# Patient Record
Sex: Female | Born: 1940 | Race: White | Hispanic: No | Smoking: Former smoker
Health system: Southern US, Community
[De-identification: ages and names within clinical notes are randomized; demographics above are authoritative.]

## PROBLEM LIST (undated history)

## (undated) DIAGNOSIS — A159 Respiratory tuberculosis unspecified: Secondary | ICD-10-CM

## (undated) DIAGNOSIS — F32A Depression, unspecified: Secondary | ICD-10-CM

## (undated) DIAGNOSIS — K589 Irritable bowel syndrome without diarrhea: Secondary | ICD-10-CM

## (undated) DIAGNOSIS — R011 Cardiac murmur, unspecified: Secondary | ICD-10-CM

## (undated) DIAGNOSIS — J449 Chronic obstructive pulmonary disease, unspecified: Secondary | ICD-10-CM

## (undated) DIAGNOSIS — I4891 Unspecified atrial fibrillation: Secondary | ICD-10-CM

## (undated) DIAGNOSIS — C801 Malignant (primary) neoplasm, unspecified: Secondary | ICD-10-CM

## (undated) DIAGNOSIS — G709 Myoneural disorder, unspecified: Secondary | ICD-10-CM

## (undated) DIAGNOSIS — F419 Anxiety disorder, unspecified: Secondary | ICD-10-CM

## (undated) DIAGNOSIS — IMO0002 Reserved for concepts with insufficient information to code with codable children: Secondary | ICD-10-CM

## (undated) DIAGNOSIS — M1990 Unspecified osteoarthritis, unspecified site: Secondary | ICD-10-CM

## (undated) DIAGNOSIS — T7840XA Allergy, unspecified, initial encounter: Secondary | ICD-10-CM

## (undated) HISTORY — DX: Unspecified atrial fibrillation: I48.91

## (undated) HISTORY — DX: Allergy, unspecified, initial encounter: T78.40XA

## (undated) HISTORY — PX: ABDOMINAL HYSTERECTOMY: SHX81

## (undated) HISTORY — DX: Depression, unspecified: F32.A

## (undated) HISTORY — PX: HYSTERECTOMY ABDOMINAL WITH SALPINGECTOMY: SHX6725

## (undated) HISTORY — DX: Anxiety disorder, unspecified: F41.9

## (undated) HISTORY — DX: Unspecified osteoarthritis, unspecified site: M19.90

## (undated) HISTORY — DX: Respiratory tuberculosis unspecified: A15.9

## (undated) HISTORY — DX: Malignant (primary) neoplasm, unspecified: C80.1

## (undated) HISTORY — DX: Myoneural disorder, unspecified: G70.9

## (undated) HISTORY — DX: Irritable bowel syndrome, unspecified: K58.9

## (undated) HISTORY — DX: Reserved for concepts with insufficient information to code with codable children: IMO0002

## (undated) HISTORY — PX: REDUCTION MAMMAPLASTY: SUR839

## (undated) HISTORY — PX: COSMETIC SURGERY: SHX468

## (undated) HISTORY — DX: Cardiac murmur, unspecified: R01.1

## (undated) HISTORY — PX: CARDIAC SURGERY: SHX584

## (undated) HISTORY — PX: EYE SURGERY: SHX253

## (undated) HISTORY — DX: Chronic obstructive pulmonary disease, unspecified: J44.9

## (undated) HISTORY — PX: CHOLECYSTECTOMY: SHX55

---

## 2004-11-11 ENCOUNTER — Ambulatory Visit: Payer: Self-pay | Admitting: Unknown Physician Specialty

## 2015-12-17 ENCOUNTER — Other Ambulatory Visit: Payer: Self-pay | Admitting: Family Medicine

## 2015-12-17 DIAGNOSIS — Z1231 Encounter for screening mammogram for malignant neoplasm of breast: Secondary | ICD-10-CM

## 2015-12-17 DIAGNOSIS — Z7185 Encounter for immunization safety counseling: Secondary | ICD-10-CM | POA: Insufficient documentation

## 2015-12-26 ENCOUNTER — Ambulatory Visit: Payer: Self-pay

## 2016-06-03 ENCOUNTER — Other Ambulatory Visit: Payer: Self-pay | Admitting: Obstetrics and Gynecology

## 2016-06-03 DIAGNOSIS — N6452 Nipple discharge: Secondary | ICD-10-CM

## 2016-06-11 ENCOUNTER — Other Ambulatory Visit: Payer: Self-pay | Admitting: *Deleted

## 2016-06-11 ENCOUNTER — Inpatient Hospital Stay
Admission: RE | Admit: 2016-06-11 | Discharge: 2016-06-11 | Disposition: A | Discharge status: Home / Self Care | Payer: Self-pay | Source: Ambulatory Visit | Attending: *Deleted | Admitting: *Deleted

## 2016-06-11 DIAGNOSIS — Z9289 Personal history of other medical treatment: Secondary | ICD-10-CM

## 2016-06-23 ENCOUNTER — Ambulatory Visit: Payer: Self-pay

## 2016-06-23 ENCOUNTER — Other Ambulatory Visit: Payer: Self-pay

## 2016-06-25 ENCOUNTER — Ambulatory Visit
Admission: RE | Admit: 2016-06-25 | Discharge: 2016-06-25 | Disposition: A | Discharge status: Home / Self Care | Payer: Medicare Other | Source: Ambulatory Visit | Attending: Obstetrics and Gynecology | Admitting: Obstetrics and Gynecology

## 2016-06-25 DIAGNOSIS — N6452 Nipple discharge: Secondary | ICD-10-CM

## 2017-01-26 DIAGNOSIS — I2510 Atherosclerotic heart disease of native coronary artery without angina pectoris: Secondary | ICD-10-CM | POA: Insufficient documentation

## 2017-01-26 DIAGNOSIS — I480 Paroxysmal atrial fibrillation: Secondary | ICD-10-CM | POA: Insufficient documentation

## 2017-01-26 DIAGNOSIS — E782 Mixed hyperlipidemia: Secondary | ICD-10-CM | POA: Insufficient documentation

## 2018-03-03 DIAGNOSIS — M791 Myalgia, unspecified site: Secondary | ICD-10-CM | POA: Insufficient documentation

## 2018-03-03 DIAGNOSIS — Z13820 Encounter for screening for osteoporosis: Secondary | ICD-10-CM | POA: Insufficient documentation

## 2018-03-03 DIAGNOSIS — M2550 Pain in unspecified joint: Secondary | ICD-10-CM | POA: Insufficient documentation

## 2018-03-03 DIAGNOSIS — R768 Other specified abnormal immunological findings in serum: Secondary | ICD-10-CM | POA: Insufficient documentation

## 2018-05-10 DIAGNOSIS — R0609 Other forms of dyspnea: Secondary | ICD-10-CM | POA: Insufficient documentation

## 2018-05-10 DIAGNOSIS — M1990 Unspecified osteoarthritis, unspecified site: Secondary | ICD-10-CM | POA: Insufficient documentation

## 2018-05-10 DIAGNOSIS — R0600 Dyspnea, unspecified: Secondary | ICD-10-CM | POA: Insufficient documentation

## 2018-06-02 ENCOUNTER — Other Ambulatory Visit: Payer: Self-pay | Admitting: Obstetrics and Gynecology

## 2018-06-02 DIAGNOSIS — Z1231 Encounter for screening mammogram for malignant neoplasm of breast: Secondary | ICD-10-CM

## 2018-06-02 DIAGNOSIS — M25551 Pain in right hip: Secondary | ICD-10-CM | POA: Insufficient documentation

## 2018-06-02 DIAGNOSIS — M25552 Pain in left hip: Secondary | ICD-10-CM | POA: Insufficient documentation

## 2018-06-24 ENCOUNTER — Ambulatory Visit
Admission: RE | Admit: 2018-06-24 | Discharge: 2018-06-24 | Disposition: A | Discharge status: Home / Self Care | Payer: Medicare HMO | Source: Ambulatory Visit | Attending: Obstetrics and Gynecology | Admitting: Obstetrics and Gynecology

## 2018-06-24 ENCOUNTER — Encounter: Payer: Self-pay | Admitting: Radiology

## 2018-06-24 DIAGNOSIS — Z1231 Encounter for screening mammogram for malignant neoplasm of breast: Secondary | ICD-10-CM | POA: Diagnosis not present

## 2018-08-23 DIAGNOSIS — R000 Tachycardia, unspecified: Secondary | ICD-10-CM | POA: Insufficient documentation

## 2018-08-23 DIAGNOSIS — F419 Anxiety disorder, unspecified: Secondary | ICD-10-CM | POA: Insufficient documentation

## 2019-11-20 ENCOUNTER — Other Ambulatory Visit (HOSPITAL_COMMUNITY): Payer: Self-pay | Admitting: Gastroenterology

## 2019-11-20 ENCOUNTER — Other Ambulatory Visit: Payer: Self-pay | Admitting: Gastroenterology

## 2019-11-20 DIAGNOSIS — R1013 Epigastric pain: Secondary | ICD-10-CM

## 2019-11-23 ENCOUNTER — Ambulatory Visit
Admission: RE | Admit: 2019-11-23 | Discharge: 2019-11-23 | Disposition: A | Discharge status: Home / Self Care | Payer: Medicare HMO | Source: Ambulatory Visit | Attending: Gastroenterology | Admitting: Gastroenterology

## 2019-11-23 ENCOUNTER — Other Ambulatory Visit: Payer: Self-pay

## 2019-11-23 ENCOUNTER — Other Ambulatory Visit: Payer: Self-pay | Admitting: Gastroenterology

## 2019-11-23 DIAGNOSIS — R1013 Epigastric pain: Secondary | ICD-10-CM

## 2020-05-13 DIAGNOSIS — K219 Gastro-esophageal reflux disease without esophagitis: Secondary | ICD-10-CM | POA: Insufficient documentation

## 2020-10-29 NOTE — Progress Notes (Signed)
[facility]  [address] [place], [place] [address] Phone: [phone]  Fax: [phone]   Clinic Day:  10/30/2020  Referring physician: Jenell Milliner, MD  Chief Complaint: [place] is a 80 y.o. female with polycythemia who is referred in consultation by Dr. Jenell Brewer for assessment and management.   HPI: The patient was seen by Dr. Vinson Brewer on 10/17/2020 for a 6 month wellness visit. Hematocrit was 48.9, hemoglobin 15.7, platelets 368,000, WBC 8,700. Calcium was 10.5 (8.7-10.4). ANA was positive (speckled: 1:80). Rheumatoid factor was < 3.5.  Sed rate was 11 and CRP was <4.0. TSH was normal.  She was noted to have persistent elevated hematocrit over the past year. She was referred to hematology.  Labs followed: 12/23/2015:  Hematocrit 44.8, hemoglobin 14.6, platelets 333,000, WBC 5,600. 01/08/2017:  Hematocrit 41.6, hemoglobin 13.8, platelets 317,000, WBC 6,000. 02/14/2018:  Hematocrit 45.4, hemoglobin 14.6, platelets 411,000, WBC 6,400. 07/11/2019:  Hematocrit 47.0, hemoglobin 15.1, platelets 387,000, WBC 8,300. 10/23/2019:  Hematocrit 49.4, hemoglobin 15.9, platelets 335,000, WBC 8,200. Calcium 10.6. 05/13/2020:  Hematocrit 48.5, hemoglobin 16.0, platelets 386,000, WBC 8,800.  10/17/2020:  Hematocrit 48.9, hemoglobin 15.7, platelets 368,000, WBC 8,700.  Erythropoietin was 6.6 (2.6-18.5) on 05/13/2020.  Symptomatically, she has been "fine". She reports fatigue, on and off fevers, sweats, runny nose, seasonal allergies, shortness of breath on exertion, headaches, "arthritis in all of her joints," nausea, shin and left arm pain, dizziness if she gets up too fast, and patches of dry, itchy skin.   She is not eating well because of nausea and depression. She used to vomit a lot but since she started eating smaller portions, it has resolved. She does not drink a lot of water.  The patient used to have tingling and burning in her feet and it  started to go up her legs. She started taking a nitric oxide inhibitor and it has resolved.  She does not think she has sleep apnea. She wakes up almost every hour during the night. She does not wake up feeling refreshed.  She states that her "colon burst due to diverticulitis" about 8 years ago. She also had a silent heart attack 8 years ago while in [place]. She has COPD and IBS. She used to have atrial fibrillation but it has resolved.  She does not do well with medications. She prefers to take vitamins. She does not take testosterone.  She had COVID-19 and is a "long hauler." She received the COVID-19 vaccine.  She was adopted; she does not have a complete family history. She does know that her mother and maternal aunt had cancer.   Past Medical History:  Diagnosis Date  . Afib (HCC)   . IBS (irritable bowel syndrome)     Past Surgical History:  Procedure Laterality Date  . CARDIAC SURGERY     corrected Afib  . CHOLECYSTECTOMY    . HYSTERECTOMY ABDOMINAL WITH SALPINGECTOMY     she has one ovary  . REDUCTION MAMMAPLASTY      Family History  Adopted: Yes  Problem Relation Age of Onset  . Cancer Mother     Social History:  reports that she has quit smoking. Her smoking use included cigarettes. She does not have any smokeless tobacco history on file. No history on file for alcohol use and drug use. The patient quit smoking 40 years ago. She smoked a "carton a week, maybe more" for four years. Before that, she smoked a much smaller amount starting in high  school. 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 She drinks a glass of wine occasionally but does not keep alcohol in her home. She previously lived in [place]. She denies exposure to radiation or toxins. She used to work as a Human resources officer. The patient is accompanied by her son, [place], today.  Allergies:  Allergies  Allergen Reactions  . Ciprofloxacin Other (See Comments)    Caused the inside of her jaw and tongue to  peel Caused the inside of her jaw and tongue to peel   . Duloxetine Other (See Comments)    Caused pt to be "violently ill" Caused pt to be "violently ill"   . Ezetimibe Other (See Comments)    GI side effects GI side effects   . Hydroxyzine Other (See Comments)    Side effects Side effects   . Paroxetine Other (See Comments)    Caused dizziness, vomiting, and diarrhea Caused dizziness, vomiting, and diarrhea   . Statins Other (See Comments)    Other Reaction: SEVERE MUSCLE PAINS Other Reaction: SEVERE MUSCLE PAINS     Current Medications: Current Outpatient Medications  Medication Sig Dispense Refill  . b complex vitamins capsule Take 1 capsule by mouth daily.    . Multiple Vitamin (MULTIVITAMIN) tablet Take 1 tablet by mouth daily.    . NON FORMULARY Heal n soothe (systemic enzyme formula)    . NON FORMULARY Colon support    . NON FORMULARY Multi collagen    . NON FORMULARY Nitric oxide sanguenol    . NON FORMULARY Bioactive liver rejuvenix liver support formula dietary supplement    . NON FORMULARY Fruits and veggies    . Turmeric 400 MG CAPS Take by mouth.    Marland Kitchen VITAMIN K PO Take by mouth.     No current facility-administered medications for this visit.    Review of Systems  Constitutional: Positive for diaphoresis, fever (on and off) and malaise/fatigue. Negative for chills and weight loss.  HENT: Negative for congestion, ear discharge, ear pain, hearing loss, nosebleeds, sinus pain, sore throat and tinnitus.        Runny nose.  Eyes: Positive for blurred vision (after reading).  Respiratory: Positive for shortness of breath (on exertion). Negative for cough, hemoptysis and sputum production.        COPD.  Cardiovascular: Negative for chest pain, palpitations and leg swelling.  Gastrointestinal: Positive for nausea. Negative for abdominal pain, blood in stool, constipation, diarrhea, heartburn, melena and vomiting.       Does not eat well.  Genitourinary:  Negative for dysuria, frequency, hematuria and urgency.  Musculoskeletal: Positive for joint pain. Negative for back pain, myalgias and neck pain.       Anterior tibia and left arm pain.  Skin: Positive for itching. Negative for rash.       Dry.  Neurological: Positive for dizziness (if she gets up too fast) and headaches. Negative for tingling, sensory change and weakness.  Endo/Heme/Allergies: Positive for environmental allergies. Does not bruise/bleed easily.  Psychiatric/Behavioral: Positive for depression. Negative for memory loss. The patient has insomnia. The patient is not nervous/anxious.   All other systems reviewed and are negative.  Performance status (ECOG): 1  Vitals Blood pressure (!) 164/77, pulse (!) 59, temperature (!) 97.3 F (36.3 C), temperature source Tympanic, resp. rate 18, weight 143 lb 4.8 oz (65 kg), SpO2 99 %.   Physical Exam Vitals and nursing note reviewed.  Constitutional:      General: She is not in acute  distress.    Appearance: She is not diaphoretic.  HENT:     Head: Normocephalic and atraumatic.     Comments: Short gray hair.    Mouth/Throat:     Mouth: Mucous membranes are moist.     Pharynx: Oropharynx is clear.  Eyes:     General: No scleral icterus.    Extraocular Movements: Extraocular movements intact.     Conjunctiva/sclera: Conjunctivae normal.     Pupils: Pupils are equal, round, and reactive to light.     Comments: Brown eyes.  Cardiovascular:     Rate and Rhythm: Normal rate and regular rhythm.     Heart sounds: Normal heart sounds. No murmur heard.   Pulmonary:     Effort: Pulmonary effort is normal. No respiratory distress.     Breath sounds: Normal breath sounds. No wheezing or rales.  Chest:     Chest wall: No tenderness.  Breasts:     Right: No axillary adenopathy or supraclavicular adenopathy.     Left: No axillary adenopathy or supraclavicular adenopathy.    Abdominal:     General: Bowel sounds are normal. There is  no distension.     Palpations: Abdomen is soft. There is no mass.     Tenderness: There is no abdominal tenderness. There is no guarding or rebound.  Musculoskeletal:        General: No swelling or tenderness. Normal range of motion.     Cervical back: Normal range of motion and neck supple.  Lymphadenopathy:     Head:     Right side of head: No preauricular, posterior auricular or occipital adenopathy.     Left side of head: No preauricular, posterior auricular or occipital adenopathy.     Cervical: No cervical adenopathy.     Upper Body:     Right upper body: No supraclavicular or axillary adenopathy.     Left upper body: No supraclavicular or axillary adenopathy.     Lower Body: No right inguinal adenopathy. No left inguinal adenopathy.  Skin:    General: Skin is warm and dry.  Neurological:     Mental Status: She is alert and oriented to person, place, and time.  Psychiatric:        Behavior: Behavior normal.        Thought Content: Thought content normal.        Judgment: Judgment normal.    No visits with results within 3 Day(s) from this visit.  Latest known visit with results is:  No results found for any previous visit.    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  Hemoglobin has ranged between 15.1 - 16.0 without trend since 07/11/2019.     Labs on 10/17/2020 revealed a hematocrit 48.9, hemoglobin 15.7, platelets 368,000, WBC 8,700.  Erythropoietin was 6.6 (2.6-18.5) on 05/13/2020.  The patient received the COVID-19 vaccine in 01/2020.  She had COVID-19 infection.  Symptomatically, she notes fatigue, on and off fevers, sweats, shortness of breath on exertion, and diffuse arthritis.  She does not think she has sleep apnea; she does not wake up feeling refreshed.  She does not take testosterone or erythropoietin.  Exam reveals no adenopathy or hepatosplenomegaly.    Plan: 1.   Labs today:  CBC with diff, CMP, ferritin, iron studies,  erythropoietin, carbon monoxide level, JAK2 with reflex.   2.   Urinalysis. 3.   CXR (PA and lateral). 4.   Erythrocytosis  She has had a hemoglobin  between 15.7 - 16.0 in the past year.  She has no cardiopulmonary disease, sleep apnea, use of erythropoietin or testosterone, or smoking.  Discuss typical work-up for erythrocytosis in women with hemoglobin > 16.   Discuss labs, urinalysis and chest x-ray.  Several questions asked and answered. 5.   CXR (PA and lateral) today. 6.   RTC in 1 week for MD assessment, review of work-up and discussion regarding direction of therapy.  I discussed the assessment and treatment plan with the patient.  The patient was provided an opportunity to ask questions and all were answered.  The patient agreed with the plan and demonstrated an understanding of the instructions.  The patient was advised to call back if the symptoms worsen or if the condition fails to improve as anticipated.  I provided 37 minutes of face-to-face time during this this encounter and > 50% was spent counseling as documented under my assessment and plan. An additional 15 minutes were spent reviewing her chart (Epic and Care Everywhere) including notes, labs, and imaging studies.   Zackry Deines C. Merlene Pullingorcoran, MD, PhD    10/30/2020, 3:27 PM  I, Danella PentonEmily J Tufford, am acting as Neurosurgeonscribe for General MotorsMelissa C. Merlene Pullingorcoran, MD, PhD.  I, Kahil Agner C. Merlene Pullingorcoran, MD, have reviewed the above documentation for accuracy and completeness, and I agree with the above.

## 2020-10-30 ENCOUNTER — Encounter: Payer: Self-pay | Admitting: Hematology and Oncology

## 2020-10-30 ENCOUNTER — Ambulatory Visit
Admission: RE | Admit: 2020-10-30 | Discharge: 2020-10-30 | Disposition: A | Discharge status: Home / Self Care | Payer: Medicare HMO | Source: Ambulatory Visit | Attending: Hematology and Oncology | Admitting: Hematology and Oncology

## 2020-10-30 ENCOUNTER — Inpatient Hospital Stay: Payer: Medicare HMO | Attending: Hematology and Oncology | Admitting: Hematology and Oncology

## 2020-10-30 ENCOUNTER — Other Ambulatory Visit: Payer: Self-pay

## 2020-10-30 ENCOUNTER — Ambulatory Visit
Admission: RE | Admit: 2020-10-30 | Discharge: 2020-10-30 | Disposition: A | Discharge status: Home / Self Care | Payer: Medicare HMO | Attending: Hematology and Oncology | Admitting: Hematology and Oncology

## 2020-10-30 ENCOUNTER — Inpatient Hospital Stay: Payer: Medicare HMO

## 2020-10-30 VITALS — BP 164/77 | HR 59 | Temp 97.3°F | Resp 18 | Wt 143.3 lb

## 2020-10-30 DIAGNOSIS — D751 Secondary polycythemia: Secondary | ICD-10-CM

## 2020-10-30 DIAGNOSIS — D509 Iron deficiency anemia, unspecified: Secondary | ICD-10-CM | POA: Diagnosis not present

## 2020-10-30 LAB — FERRITIN: Ferritin: 56 ng/mL (ref 11–307)

## 2020-10-30 LAB — COMPREHENSIVE METABOLIC PANEL
ALT: 19 U/L (ref 0–44)
AST: 19 U/L (ref 15–41)
Albumin: 4.5 g/dL (ref 3.5–5.0)
Alkaline Phosphatase: 67 U/L (ref 38–126)
Anion gap: 11 (ref 5–15)
BUN: 11 mg/dL (ref 8–23)
CO2: 27 mmol/L (ref 22–32)
Calcium: 9.5 mg/dL (ref 8.9–10.3)
Chloride: 104 mmol/L (ref 98–111)
Creatinine, Ser: 0.69 mg/dL (ref 0.44–1.00)
GFR, Estimated: >60 mL/min (ref >60)
Glucose, Bld: 95 mg/dL (ref 70–99)
Potassium: 3.9 mmol/L (ref 3.5–5.1)
Sodium: 142 mmol/L (ref 135–145)
Total Bilirubin: 0.3 mg/dL (ref 0.3–1.2)
Total Protein: 7.7 g/dL (ref 6.5–8.1)

## 2020-10-30 LAB — CBC WITH DIFFERENTIAL/PLATELET
Abs Immature Granulocytes: 0.07 10*3/uL (ref 0.00–0.07)
Basophils Absolute: 0.1 10*3/uL (ref 0.0–0.1)
Basophils Relative: 1 %
Eosinophils Absolute: 0.2 10*3/uL (ref 0.0–0.5)
Eosinophils Relative: 3 %
HCT: 44.4 % (ref 36.0–46.0)
Hemoglobin: 14.6 g/dL (ref 12.0–15.0)
Immature Granulocytes: 1 %
Lymphocytes Relative: 37 %
Lymphs Abs: 2.8 10*3/uL (ref 0.7–4.0)
MCH: 30.4 pg (ref 26.0–34.0)
MCHC: 32.9 g/dL (ref 30.0–36.0)
MCV: 92.5 fL (ref 80.0–100.0)
Monocytes Absolute: 0.6 10*3/uL (ref 0.1–1.0)
Monocytes Relative: 8 %
Neutro Abs: 3.8 10*3/uL (ref 1.7–7.7)
Neutrophils Relative %: 50 %
Platelets: 343 10*3/uL (ref 150–400)
RBC: 4.8 MIL/uL (ref 3.87–5.11)
RDW: 13.2 % (ref 11.5–15.5)
WBC: 7.5 10*3/uL (ref 4.0–10.5)
nRBC: 0 % (ref 0.0–0.2)

## 2020-10-30 LAB — IRON AND TIBC
Iron: 74 ug/dL (ref 28–170)
Saturation Ratios: 21 % (ref 10.4–31.8)
TIBC: 358 ug/dL (ref 250–450)
UIBC: 284 ug/dL

## 2020-10-30 LAB — URINALYSIS, COMPLETE (UACMP) WITH MICROSCOPIC
Bilirubin Urine: NEGATIVE
Glucose, UA: NEGATIVE mg/dL
Hgb urine dipstick: NEGATIVE
Ketones, ur: NEGATIVE mg/dL
Leukocytes,Ua: NEGATIVE
Nitrite: NEGATIVE
Protein, ur: NEGATIVE mg/dL
Specific Gravity, Urine: 1.01 (ref 1.005–1.030)
pH: 5.5 (ref 5.0–8.0)

## 2020-10-30 NOTE — Progress Notes (Signed)
One black out episode recently SOB on exertion COPD Numbness and ting in extremeties

## 2020-10-31 LAB — CARBON MONOXIDE, BLOOD (PERFORMED AT REF LAB): Carbon Monoxide, Blood: 2.1 % (ref 0.0–3.6)

## 2020-10-31 LAB — ERYTHROPOIETIN: Erythropoietin: 8.3 m[IU]/mL (ref 2.6–18.5)

## 2020-11-05 NOTE — Progress Notes (Signed)
[facility]  [address], [address] [place], [place] [address] Phone: [phone]  Fax: [phone]   Clinic Day:  11/06/2020  Referring physician: Jenell MillinerLuyando, Yvonne, MD  Chief Complaint: Stacy Burkentoinette N Brewer is a 80 y.o. female with polycythemia who is seen for review of work-up and discussion regarding direction of therapy.  HPI: The patient was last seen in the hematology clinic on 10/30/2020 for new patient assessment.  Hemoglobin had ranged between 15.1-16 without trend since 07/11/2019.  Symptomatically, she felt fine.  She noted fatigue, on and off fevers, sweats, shortness of breath on exertion, and diffuse arthritis.  She was not eating well; she did not drink a lot of water.  She denied sleep apnea and she woke up almost every hour during the night.  She was noted to have COPD.  Work-up revealed a hematocrit of 44.4, hemoglobin 14.6, platelets 343,000, WBC 7,500 (ANC 3800). CMP was normal. Ferritin was 56 with an iron saturation of 21% and a TIBC of 358. Carbon monoxide was 2.1% (0-3.6%). Erythropoietin level was 8.3 (2.6-18.5). Urinalysis revealed no hematuria. JAK2 studies are pending.  CXR on 10/30/2020 revealed hyperexpanded lungs. There was no edema or airspace opacity. Heart size was normal. There was no adenopathy.  During the interim, she has felt "great." Her energy level is very low. Her runny nose, dizziness, headaches, blurred vision after reading, dry skin, and shortness of breath on exertion are stable. Her diet is okay. She eats a lot of vegetables and is trying to cut back on her red meat intake. Her neck is swollen in the morning. Her mood is up and down.  She takes Heal-n-Soothe and a nitric oxide inhibitor for her joints. Her fevers and sweats have resolved.  The patient wants to sign a DNR today. States that "if it's her time to go, it's her time." She is very satisfied with her life and states that she is not afraid of death.   Past  Medical History:  Diagnosis Date  . Afib (HCC)   . IBS (irritable bowel syndrome)     Past Surgical History:  Procedure Laterality Date  . CARDIAC SURGERY     corrected Afib  . CHOLECYSTECTOMY    . HYSTERECTOMY ABDOMINAL WITH SALPINGECTOMY     she has one ovary  . REDUCTION MAMMAPLASTY      Family History  Adopted: Yes  Problem Relation Age of Onset  . Cancer Mother     Social History:  reports that she has quit smoking. Her smoking use included cigarettes. She has never used smokeless tobacco. She reports previous alcohol use. She reports previous drug use. The patient quit smoking 40 years ago. She smoked a "carton a week, maybe more" for four years. Before that, she smoked a much smaller amount starting in high school. 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 She drinks a glass of wine occasionally but does not keep alcohol in her home. [place] She denies exposure to radiation or toxins. 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 Her son is Jomarie LongsJoseph. The patient is alone today.  Allergies:  Allergies  Allergen Reactions  . Ciprofloxacin Other (See Comments)    Caused the inside of her jaw and tongue to peel Caused the inside of her jaw and tongue to peel   . Duloxetine Other (See Comments)    Caused pt to be "violently ill" Caused pt to be "violently ill"   .  Ezetimibe Other (See Comments)    GI side effects GI side effects   . Hydroxyzine Other (See Comments)    Side effects Side effects   . Paroxetine Other (See Comments)    Caused dizziness, vomiting, and diarrhea Caused dizziness, vomiting, and diarrhea   . Statins Other (See Comments)    Other Reaction: SEVERE MUSCLE PAINS Other Reaction: SEVERE MUSCLE PAINS     Current Medications: Current Outpatient Medications  Medication Sig Dispense Refill  . b complex vitamins capsule Take 1 capsule by mouth daily.    . Multiple Vitamin (MULTIVITAMIN) tablet  Take 1 tablet by mouth daily.    . NON FORMULARY Heal n soothe (systemic enzyme formula)    . NON FORMULARY Colon support    . NON FORMULARY daily. Multi collagen    . NON FORMULARY daily. Nitric oxide sanguenol    . NON FORMULARY Fruits and veggies    . Turmeric 400 MG CAPS Take by mouth daily.    . NON FORMULARY Bioactive liver rejuvenix liver support formula dietary supplement (Patient not taking: Reported on 11/06/2020)    . VITAMIN K PO Take by mouth. (Patient not taking: Reported on 11/06/2020)     No current facility-administered medications for this visit.    Review of Systems  Constitutional: Positive for malaise/fatigue and weight loss (1 lb). Negative for chills, diaphoresis and fever.  HENT: Negative for congestion, ear discharge, ear pain, hearing loss, nosebleeds, sinus pain, sore throat and tinnitus.        Runny nose  Eyes: Positive for blurred vision (after reading).  Respiratory: Positive for shortness of breath (on exertion). Negative for cough, hemoptysis and sputum production.   Cardiovascular: Negative for chest pain, palpitations and leg swelling.       COPD  Gastrointestinal: Negative for abdominal pain, blood in stool, constipation, diarrhea, heartburn, melena, nausea and vomiting.  Genitourinary: Negative for dysuria, frequency, hematuria and urgency.  Musculoskeletal: Positive for joint pain. Negative for back pain, myalgias and neck pain.       Shin and left arm pain. Neck is swollen in the morning.  Skin: Positive for itching. Negative for rash.       Dry  Neurological: Positive for dizziness (if she gets up too fast) and headaches. Negative for tingling, sensory change and weakness.  Endo/Heme/Allergies: Positive for environmental allergies. Does not bruise/bleed easily.  Psychiatric/Behavioral: Negative for depression (mood is up and down) and memory loss. The patient is not nervous/anxious and does not have insomnia.   All other systems reviewed and are  negative.  Performance status (ECOG): 1  Vitals Blood pressure (!) 149/86, pulse 97, temperature 98.2 F (36.8 C), temperature source Oral, weight 142 lb 8.4 oz (64.7 kg), SpO2 100 %.   Physical Exam Vitals and nursing note reviewed.  Constitutional:      General: She is not in acute distress.    Appearance: She is not diaphoretic.  HENT:     Head:     Comments: Short gray hair. Eyes:     General: No scleral icterus.    Conjunctiva/sclera: Conjunctivae normal.     Comments: Brown eyes.  Neurological:     Mental Status: She is alert and oriented to person, place, and time.  Psychiatric:        Behavior: Behavior normal.        Thought Content: Thought content normal.        Judgment: Judgment normal.    No visits with results within 3  Day(s) from this visit.  Latest known visit with results is:  Clinical Support on 10/30/2020  Component Date Value Ref Range Status  . Carbon Monoxide, Blood 10/30/2020 2.1  0.0 - 3.6 % Final   Comment: (NOTE)                            Environmental Exposure:                             Nonsmokers           <3.7                             Smokers              <9.9                            Occupational Exposure:                             BEI                   3.5                                Detection Limit =  0.2 Performed At: [employer] [address] [place], [place] [id number] Jolene Schimke MD WU:[id number]   . JAK2 GenotypR 10/30/2020 Comment   Final   Comment: (NOTE) Result: NEGATIVE for the JAK2 V617F mutation. Interpretation:  The G to T nucleotide change encoding the V617F mutation was not detected.  This result does not rule out the presence of the JAK2 mutation at a level below the sensitivity of detection of this assay, or the presence of other mutations within JAK2 not detected by this assay.  This result does not rule out a diagnosis of polycythemia vera, essential thrombocythemia or idiopathic  myelofibrosis as the V617F mutation is not detected in all patients with these disorders.   Marland Kitchen BACKGROUND: 10/30/2020 Comment   Final   Comment: (NOTE) JAK2 is a cytoplasmic tyrosine kinase with a key role in signal transduction from multiple hematopoietic growth factor receptors. A point mutation within exon 14 of the JAK2 gene (N5621H) encoding a valine to phenylalanine substitution at position 617 of the JAK2 protein (V617F) has been identified in most patients with polycythemia vera, and in about half of those with either essential thrombocythemia or idiopathic myelofibrosis. The V617F has also been detected, although infrequently, in other myeloid disorders such as chronic myelomonocytic leukemia and chronic neutrophilic luekemia. V617F is an acquired mutation that alters a highly conserved valine present in the negative regulatory JH2 domain of the JAK2 protein and is predicted to dysregulate kinase activity. Methodology: Total genomic DNA was extracted and subjected to TaqMan real-time PCR amplification/detection. Two amplification products per sample were monitored by real-time PCR using primers/probes s                          pecific to JAK2 wild type (WT) and JAK2 mutant V617F. The ABI7900 Absolute Quantitation software will compare the patient specimen valuse to the standard curves and generate percent values for wild type and mutant type. In  vitro studies have indicated that this assay has an analytical sensitivity of 1%. References: Baxter EJ, Scott Lenoria FarrierLM, Campbell PJ, et al. Acquired mutation of the tyrosine kinase JAK2 in human myeloproliferative disorders. Lancet. 2005 Mar 19-25; 365(9464):1054-1061. Milus BanisterJames C, Ugo V, Le Couedic JP. A unique clonal JAK2 mutation leading to constitutive signaling causes polycythaemia vera. Nature. 2005 Apr 28; 434(7037):1144-1148. Kralovics R, Passamonti F, Buser AS, et al. A gain-of-function mutation of JAK2 in myeloproliferative  disorders. N Engl J Med. 2005 Apr 28; 352(17):1779-1790.   . Director Review, JAK2 10/30/2020 Comment   Final   Comment: (NOTE) Mellody Lifean Wang, PhD, [facility]    Director, Molecular Oncology    [facility]    [place], [place] [address]    [phone] This test was developed and its performance characteristics determined by [facility]. It has not been cleared or approved by the Food and Drug Administration.   Marland Kitchen. REFLEX: 10/30/2020 Comment   Final   Comment: (NOTE) Reflex to CALR Mutation Analysis, JAK2 Exon 12-15 Mutation Analysis, and MPL Mutation Analysis is indicated.   Marland Kitchen. Extraction 10/30/2020 Completed   Corrected   Comment: (NOTE) Performed At: [facility] [address] [place], [place] [id number] Maurine Simmeringhenn Anjen MDPhD ZH:[phone] Performed At: [facility] [address] [place], [place] [id number] Maurine Simmeringhenn Anjen MDPhD [phone]   . Erythropoietin 10/30/2020 8.3  2.6 - 18.5 mIU/mL Final   Comment: (NOTE) Beckman Coulter UniCel DxI 800 Immunoassay System Values obtained with different assay methods or kits cannot be used interchangeably. Results cannot be interpreted as absolute evidence of the presence or absence of malignant disease. Performed At: [facility] [place] [address] [place], [place] [id number] Jolene SchimkeNagendra Sanjai MD ZD:[id number]:[phone]   . Iron 10/30/2020 74  28 - 170 ug/dL Final  . TIBC 95/18/841601/19/2022 358  250 - 450 ug/dL Final  . Saturation Ratios 10/30/2020 21  10.4 - 31.8 % Final  . UIBC 10/30/2020 284  ug/dL Final   Performed at [facility], [address] [place], [place] [address]  . Ferritin 10/30/2020 56  11 - 307 ng/mL Final   Performed at [facility], [address] [place], [place] [address]  . Sodium 10/30/2020 142  135 - 145 mmol/L Final  . Potassium 10/30/2020 3.9  3.5 - 5.1 mmol/L Final  . Chloride 10/30/2020 104  98 - 111 mmol/L Final  . CO2 10/30/2020 27  22 - 32 mmol/L Final   . Glucose, Bld 10/30/2020 95  70 - 99 mg/dL Final   Glucose reference range applies only to samples taken after fasting for at least 8 hours.  . BUN 10/30/2020 11  8 - 23 mg/dL Final  . Creatinine, Ser 10/30/2020 0.69  0.44 - 1.00 mg/dL Final  . Calcium 93/23/557301/19/2022 9.5  8.9 - 10.3 mg/dL Final  . Total Protein 10/30/2020 7.7  6.5 - 8.1 g/dL Final  . Albumin 22/02/542701/19/2022 4.5  3.5 - 5.0 g/dL Final  . AST 06/23/762801/19/2022 19  15 - 41 U/L Final  . ALT 10/30/2020 19  0 - 44 U/L Final  . Alkaline Phosphatase 10/30/2020 67  38 - 126 U/L Final  . Total Bilirubin 10/30/2020 0.3  0.3 - 1.2 mg/dL Final  . GFR, Estimated 10/30/2020 >60  >60 mL/min Final   Comment: (NOTE) Calculated using the CKD-EPI Creatinine Equation (2021)   . Anion gap 10/30/2020 11  5 - 15 Final   Performed at [facility],  [address], [place], [place] [address]  . WBC 10/30/2020 7.5  4.0 - 10.5 K/uL Final  . RBC 10/30/2020 4.80  3.87 - 5.11 MIL/uL Final  . Hemoglobin 10/30/2020 14.6  12.0 - 15.0 g/dL Final  . HCT 12/45/8099 44.4  36.0 - 46.0 % Final  . MCV 10/30/2020 92.5  80.0 - 100.0 fL Final  . MCH 10/30/2020 30.4  26.0 - 34.0 pg Final  . MCHC 10/30/2020 32.9  30.0 - 36.0 g/dL Final  . RDW 83/38/2505 13.2  11.5 - 15.5 % Final  . Platelets 10/30/2020 343  150 - 400 K/uL Final  . nRBC 10/30/2020 0.0  0.0 - 0.2 % Final  . Neutrophils Relative % 10/30/2020 50  % Final  . Neutro Abs 10/30/2020 3.8  1.7 - 7.7 K/uL Final  . Lymphocytes Relative 10/30/2020 37  % Final  . Lymphs Abs 10/30/2020 2.8  0.7 - 4.0 K/uL Final  . Monocytes Relative 10/30/2020 8  % Final  . Monocytes Absolute 10/30/2020 0.6  0.1 - 1.0 K/uL Final  . Eosinophils Relative 10/30/2020 3  % Final  . Eosinophils Absolute 10/30/2020 0.2  0.0 - 0.5 K/uL Final  . Basophils Relative 10/30/2020 1  % Final  . Basophils Absolute 10/30/2020 0.1  0.0 - 0.1 K/uL Final  . Immature Granulocytes 10/30/2020 1  % Final  . Abs Immature Granulocytes 10/30/2020  0.07  0.00 - 0.07 K/uL Final   Performed at [facility], [address], [place], [place] [address]  . Color, Urine 10/30/2020 YELLOW  YELLOW Final  . APPearance 10/30/2020 CLEAR  CLEAR Final  . Specific Gravity, Urine 10/30/2020 1.010  1.005 - 1.030 Final  . pH 10/30/2020 5.5  5.0 - 8.0 Final  . Glucose, UA 10/30/2020 NEGATIVE  NEGATIVE mg/dL Final  . Hgb urine dipstick 10/30/2020 NEGATIVE  NEGATIVE Final  . Bilirubin Urine 10/30/2020 NEGATIVE  NEGATIVE Final  . Ketones, ur 10/30/2020 NEGATIVE  NEGATIVE mg/dL Final  . Protein, ur 34/19/3790 NEGATIVE  NEGATIVE mg/dL Final  . Nitrite 24/06/7352 NEGATIVE  NEGATIVE Final  . Glori Luis 10/30/2020 NEGATIVE  NEGATIVE Final  . Squamous Epithelial / LPF 10/30/2020 0-5  0 - 5 Final  . WBC, UA 10/30/2020 6-10  0 - 5 WBC/hpf Final  . RBC / HPF 10/30/2020 0-5  0 - 5 RBC/hpf Final  . Bacteria, UA 10/30/2020 FEW* NONE SEEN Final  . WBC Clumps 10/30/2020 PRESENT   Final   Performed at [facility], [address], [place], [place] [address]  . CALR Mutation Detection Result 10/30/2020 Comment   Final   Comment: (NOTE) NEGATIVE No insertions or deletions were detected within the analyzed region of the calreticulin (CALR) gene. A negative result does not entirely exclude the possibility of a clonal population carrying CALR gene mutations that are not covered by this assay. Results should be interpreted in conjunction with clinical and laboratory findings for the most accurate interpretation.   . Background: 10/30/2020 Comment   Final   Comment: (NOTE) The calcium-binding endoplasmic reticulin chaperone protein, calreticulin (CALR), is somatically mutated in approximately 70% of patients with JAK2-negative essential thrombocythemia (ET) and 60- 88% of patients with JAK2-negative primary myelofibrosis(PMF). Only a minority of patients (approximately 8%) with myelodysplasia have mutations in  CALR gene. CALR mutations  are rarely detected in patients with de novo acute myeloid leukemia, chronic myelogenous leukemia, lymphoid leukemia, or solid tumors. CALR mutations are not detected in polycythemia and generally appear to be mutually exclusive  with JAK2 mutations and MPL mutations. The majority of mutational changes involve a variety of insertion or deletion mutations in exon 9 of the calreticulin gene: approximately 53% of all CALR mutations are a 52 bp deletion (type-1) while the second most prevalent mutation (approximately 32%) contains a 5 bp insertion (type-2). Other mutations (non-type 1 or type 2) are seen                           in a small minority of cases. CALR mutations in PMF tend to be associated with a favorable prognosis compared to JAK2 V617F mutations, whereas primary myelofibrosis negative for CALR, JAK2 V617F and MPL mutations (so-called triple negative) is associated with a poor prognosis and shorter survival. The detection of a CALR gene mutation aids in the specific diagnosis of a myeloproliferative neoplasm, and help distinguish this clonal disease from a benign reactive process.   . Methodology: 10/30/2020 Comment   Final   Comment: (NOTE) Genomic DNA was isolated from the provided specimen. Polymerase chain reaction (PCR) of exon 9 of the CALR gene was performed with specific fluorescent-labeled primers, and the PCR product was analyzed by capillary gel electrophoresis to determine the size of the PCR products. This PCR assay is capable of detecting a mutant cell population with a sensitivity of 5 mutant cells per 100 normal cells. A negative result does not exclude the presence of a myeloproliferative disorder or other neoplastic process. This test was developed and its performance characteristics determined by [facility]. It has not been cleared or approved by the Food and Drug Administration. The FDA has determined that such clearance or approval is not necessary.   .  References: 10/30/2020 Comment   Final   Comment: (NOTE) 1. Klampfel, T. et al. (2013) Somatic mutations of calreticulin in   myeloproliferative neoplasms. New Engl. J. Med. 720:9470-9628. 2. Gerre Couch et al. (2013) Somatic CALR mutations in   myeloproliferative neoplasms with nonmutated JAK2. New Engl. J.   Med. [phone].   Marland Kitchen Director Review 10/30/2020 Comment   Final   Comment: (NOTE) Mellody Life, PhD, [facility]    Director, Molecular Oncology    [facility]    [address] [place], [place] [address]    [phone]   . JAK2 Exons 12-15 Mut Det PCR: 10/30/2020 Comment   Final   Comment: (NOTE) NEGATIVE JAK2 mutations were not detected in exons 12, 13, 14 and 15. This result does not rule out the presence of JAK2 mutation at a level below the detection sensitivity of this assay, the presence of other mutations outside the analyzed region of the JAK2 gene, or the presence of a myeloproliferative or other neoplasm. Result must be correlated with other clinical data for the most accurate diagnosis.   Marland Kitchen BACKGROUND: 10/30/2020 Comment   Final   Comment: (NOTE) JAK2 V617F mutation is detected in patients with polycythemia vera (95%), essential thrombocythemia (50%) and primary myelofibrosis (50%). A small percentage of JAK2 mutation positive patients (3.3%) contain other non-V617F mutations within exons 12 to 15. The detection of a JAK2 gene mutation aids in the specific diagnosis of a myeloproliferative neoplasm, and help distinguish this clonal disease from a benign reactive process.   . Method 10/30/2020 Comment   Final   Comment: (NOTE) Total RNA was purified from the provided specimen. The JAK2 gene region covering exons 12 to 15 was subjected to reverse- transcription coupled PCR amplification, and bi-directional sequencing to identify  sequence variations. This assay has a sensitivity to detect approximately 15% population of  cells containing the JAK2 mutations in a background of non-mutant cells. This test was developed and its performance characteristics determined by [facility]. It has not been cleared or approved by the Food and Drug Administration.   . References 10/30/2020 Comment   Final   Comment: (NOTE) Algasham, N. et al. Detection of mutations in JAK2 exons 12-15 by Sanger sequencing. Int J Lab Hemato. 2015, 38:34-41. Garlon Hatchet al. Mutation profile of JAK2 transcripts in patients with chronic myeloproliferative neoplasias. J Mol Diagn. 2009, 11:49-53.   Marland Kitchen DIRECTOR REVIEW: 10/30/2020 Comment   Final   Comment: (NOTE) Rushie Chestnut, PhD, [facility]   Genomics Associate Technical Director, Clinical Cytogenetics   Center [facility] / Pathology   [facility]   [address], [place],   [place], [place] [address]   [phone]   . MPL MUTATION ANALYSIS RESULT: 10/30/2020 Comment   Final   Comment: (NOTE) No MPL mutation was identified in the provided specimen of this individual. Results should be interpreted in conjunction with clinical and other laboratory findings for the most accurate interpretation.   Marland Kitchen BACKGROUND: 10/30/2020 Comment   Final   Comment: (NOTE) MPL (myeloproliferative leukemia virus oncogene homology) belongs to the hematopoietin superfamily and enables its ligand thrombopoietin to facilitate both global hematopoiesis and megakaryocyte growth and differentiation. MPL W515 mutations are present in patients with primary myelofibrosis (PMF) and essential thrombocythemia (ET) at a frequency of approximately 5% and 1% respectively. The S505 mutation is detected in patients with hereditary thrombocythemia.   Marland Kitchen METHODOLOGY: 10/30/2020 Comment   Final   Comment: (NOTE) Genomic DNA was purified from the provided specimen. MPL gene region covering the S505N and W515L/K mutations were subjected to  PCR amplification and bi-directional sequencing in duplicate to identify sequence variations. This assay has a sensitivity to detect approximately 20-25% population of cells containing the MPL mutations in a background of non-mutant cells. This assay will not detect the mutation below the sensitivity of this assay. Molecular- based testing is highly accurate, but as in any laboratory test, rare diagnostic errors may occur.   Marland Kitchen REFERENCES: 10/30/2020 Comment   Final   Comment: (NOTE) 1. Pardanani AD, et al. (2006). MPL515 mutations in   myeloproliferative and other myeloid disorders: a study   of 1182 patients. Blood 633:[phone]. 2. Gavin Pound and Levine RL. (2008). JAK2 and MPL   mutations in myeloproliferative neoplasms: discovery and   science. Leukemia 22:1813-1817. 3. Toribio Harbour, et al. (2009). Evidence for a founder effect   of the MPL-S505N mutation in eight [place] pedigrees with   hereditary thrombocythemia. Haematologica 94(10):1368-   1374.   Marland Kitchen DIRECTOR REVIEW: 10/30/2020 Comment   Final   Comment: (NOTE) Rushie Chestnut, PhD, [facility]   Genomics Associate Technical Director, Clinical Cytogenetics   Center for Molecular Biology / Pathology   [facility]   [address], [place],   [place], [place] [address]   [phone] This test was developed and its performance characteristics determined by [facility]. It has not been cleared or approved by the Food and Drug Administration.   . Extraction 10/30/2020 Comment   Final   Comment: (NOTE) This sample has been received and DNA extraction has been performed. 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   Hemoglobin has ranged between 15.1 - 16.0 without trend since 07/11/2019.   Labs on 10/17/2020 revealed a hematocrit 48.9, hemoglobin 15.7, platelets 368,000, WBC 8,700.  Erythropoietin was 6.6 (2.6-18.5) on 05/13/2020.  Work-up on 10/30/2020 revealed a hematocrit of 44.4, hemoglobin 14.6, platelets 343,000, WBC 7,500 (ANC 3800). CMP was normal. Ferritin was 56 with an iron saturation of 21% and a TIBC of 358. Carbon monoxide was 2.1% (0-3.6%). Erythropoietin level was 8.3 (2.6-18.5). Urinalysis revealed no hematuria. JAK2 studies are pending.  CXR on 10/30/2020 revealed hyperexpanded lungs. There was no edema or airspace opacity. Heart size was normal. There was no adenopathy.  The patient received the COVID-19 vaccine in 01/2020. She had COVID-19 infection.  Symptomatically, she feels "great."  Her energy level is low.  She denies any fevers or sweats.  Plan: 1.   Review work-up 2.   Erythrocytosis  She has had a hemoglobin between 15.7 - 16.0 in the past year.             Hematocrit was 44.4.  Hemoglobin 14.6.  MCV 92.5 on 10/30/2020.    She has no cardiopulmonary disease, sleep apnea, use of erythropoietin or testosterone, or smoking.  Work-up reveals a normal carbon monoxide level, erythropoietin level, and no evidence of hematuria.  Chest x-ray was normal.    JAK2 testing is pending.  Several questions were asked and answered.  Discuss plan to follow-up as needed. 3.   RN: Copy of labs. 4.   RN:  Call patient with JAK2 results. 5.   MOST form completed. 6.   RTC prn.  Addendum: JAK2 testing, exon 12-15, CALR,and MPL were normal.  I discussed the assessment and treatment plan with the patient.  The patient was provided an opportunity to ask questions and all were answered.  The patient agreed with the plan and demonstrated an understanding of the instructions.  The patient was advised to call back if the symptoms worsen or if the condition fails to improve as anticipated.  I  provided 15 minutes of face-to-face time during this this encounter and > 50% was spent counseling as documented under my assessment and plan. An additional 5 minutes were spent reviewing her chart (Epic and Care Everywhere) including notes, labs, and imaging studies.    Stori Royse C. Merlene Pulling, MD, PhD    11/06/2020, 5:15 PM  I, Danella Penton Tufford, am acting as Neurosurgeon for [employer]. Merlene Pulling, MD, PhD.  I, Rudolfo Brandow C. Merlene Pulling, MD, have reviewed the above documentation for accuracy and completeness, and I agree with the above.

## 2020-11-06 ENCOUNTER — Other Ambulatory Visit: Payer: Self-pay

## 2020-11-06 ENCOUNTER — Encounter: Payer: Self-pay | Admitting: Hematology and Oncology

## 2020-11-06 ENCOUNTER — Inpatient Hospital Stay (HOSPITAL_BASED_OUTPATIENT_CLINIC_OR_DEPARTMENT_OTHER): Payer: Medicare HMO | Admitting: Hematology and Oncology

## 2020-11-06 VITALS — BP 149/86 | HR 97 | Temp 98.2°F | Wt 142.5 lb

## 2020-11-06 DIAGNOSIS — D751 Secondary polycythemia: Secondary | ICD-10-CM | POA: Diagnosis not present

## 2020-11-06 NOTE — Patient Instructions (Signed)
  Please call or follow-up in clinic if any concerns.

## 2020-11-07 ENCOUNTER — Telehealth: Payer: Self-pay | Admitting: *Deleted

## 2020-11-07 NOTE — Telephone Encounter (Signed)
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

## 2020-11-08 LAB — CALR + JAK2 E12-15 + MPL (REFLEXED)

## 2020-11-08 LAB — JAK2 V617F, W REFLEX TO CALR/E12/MPL

## 2022-01-14 ENCOUNTER — Ambulatory Visit
Admission: EM | Admit: 2022-01-14 | Discharge: 2022-01-14 | Disposition: A | Discharge status: Home / Self Care | Payer: Medicare HMO | Attending: Emergency Medicine | Admitting: Emergency Medicine

## 2022-01-14 ENCOUNTER — Encounter: Payer: Self-pay | Admitting: Emergency Medicine

## 2022-01-14 ENCOUNTER — Other Ambulatory Visit: Payer: Self-pay

## 2022-01-14 ENCOUNTER — Ambulatory Visit (INDEPENDENT_AMBULATORY_CARE_PROVIDER_SITE_OTHER): Payer: Medicare HMO

## 2022-01-14 DIAGNOSIS — N3000 Acute cystitis without hematuria: Secondary | ICD-10-CM | POA: Insufficient documentation

## 2022-01-14 DIAGNOSIS — R0989 Other specified symptoms and signs involving the circulatory and respiratory systems: Secondary | ICD-10-CM

## 2022-01-14 DIAGNOSIS — J441 Chronic obstructive pulmonary disease with (acute) exacerbation: Secondary | ICD-10-CM | POA: Diagnosis not present

## 2022-01-14 DIAGNOSIS — R059 Cough, unspecified: Secondary | ICD-10-CM | POA: Diagnosis not present

## 2022-01-14 DIAGNOSIS — J029 Acute pharyngitis, unspecified: Secondary | ICD-10-CM

## 2022-01-14 LAB — URINALYSIS, ROUTINE W REFLEX MICROSCOPIC
Glucose, UA: NEGATIVE mg/dL
Hgb urine dipstick: NEGATIVE
Nitrite: NEGATIVE
Protein, ur: 30 mg/dL — AB
Specific Gravity, Urine: 1.025 (ref 1.005–1.030)
pH: 6 (ref 5.0–8.0)

## 2022-01-14 LAB — URINALYSIS, MICROSCOPIC (REFLEX): Squamous Epithelial / HPF: >50 (ref 0–5)

## 2022-01-14 MED ORDER — AMOXICILLIN-POT CLAVULANATE 875-125 MG PO TABS: 1.0000 | ORAL_TABLET | Freq: Two times a day (BID) | ORAL | 0 refills | Status: DC

## 2022-01-14 MED ORDER — PREDNISONE 20 MG PO TABS: 40.0000 mg | ORAL_TABLET | Freq: Every day | ORAL | 0 refills | Status: DC

## 2022-01-14 NOTE — ED Triage Notes (Addendum)
Pt comes in c/o cough, congestion. Reports that 7 days ago she started with a sore throat a cough, she believes that she has pneumonia now. Reports productive sough with yellow sputum and discomfort in her left lung. Reports discomfort in chest when taking deep breath ?

## 2022-01-14 NOTE — Discharge Instructions (Signed)
Your chest x-ray was negative for pneumonia, therefore we will proceed with symptoms being the result of a COPD flareup which is most likely related to viral exposure and seasonal pollen ? ?Begin use of prednisone every morning with food for the next 5 days, this medicine is to reduce inflammation ? ?Begin use of Augmentin, take twice daily for the next 7 days, this medicine will be used to cover for respiratory bacteria as well as urinary ? ?Your urinalysis showed Kanetra Ho blood cells and bacteria under the microscope therefore we will move forward with treatment with above antibiotic ? ?Your urine has been sent to the lab to determine exactly which bacteria is present, if any changes need to be made to your medication you will be notified ? ?May increase your fluid intake through use of water to further help flush your kidneys and bladder ? ?As always practice good feminine hygiene, wiping from front to back, avoidance of scented vaginal products and urinating after all sexual encounters ? ?If your symptoms continue to persist past use of medication please follow-up with your primary doctor or urgent care for reevaluation ?

## 2022-01-14 NOTE — ED Provider Notes (Signed)
?[facility] ? ? ? ?CSN: [id number] ?Arrival date & time: 01/14/22  0847 ? ? ?  ? ?History   ?Chief Complaint ?Chief Complaint  ?Patient presents with  ? Cough  ? ? ?HPI ?Stacy Brewer is a 81 y.o. female.  ? ?Patient presents with a productive cough with greenish-yellow sputum, shortness of breath present at rest and exertion for 7 days, initially had a low-grade fever for 1 day which has resolved and a sore throat for 1 day which has resolved.  Endorses  associated "left lung pain".  possible sick contacts.  Has attempted use of over-the-counter Mucinex which has been helpful.  History of reoccurring and pneumonia, A-fib. ? ?Patient concerned with the bilateral flank pain, darkening urine, urinary frequency and urgency for 2 days.  Has not attempted treatment of symptoms.  Denies hematuria, dysuria, vaginal symptoms, lower abdominal pain or pressure. ? ? ? ?Past Medical History:  ?Diagnosis Date  ? Afib ([place])   ? IBS (irritable bowel syndrome)   ? ? ?Patient Active Problem List  ? Diagnosis Date Noted  ? Erythrocytosis 10/30/2020  ? GERD (gastroesophageal reflux disease) 05/13/2020  ? Anxiety 08/23/2018  ? Tachycardia 08/23/2018  ? Bilateral hip pain 06/02/2018  ? DJD (degenerative joint disease) 05/10/2018  ? DOE (dyspnea on exertion) 05/10/2018  ? Arthralgia 03/03/2018  ? Myalgia 03/03/2018  ? Hypercalcemia 03/03/2018  ? Positive ANA (antinuclear antibody) 03/03/2018  ? Screening for osteoporosis 03/03/2018  ? Coronary artery disease involving native coronary artery of native heart 01/26/2017  ? Hyperlipidemia, mixed 01/26/2017  ? Paroxysmal A-fib ([place]) 01/26/2017  ? Vaccine counseling 12/17/2015  ? ? ?Past Surgical History:  ?Procedure Laterality Date  ? CARDIAC SURGERY    ? corrected Afib  ? CHOLECYSTECTOMY    ? HYSTERECTOMY ABDOMINAL WITH SALPINGECTOMY    ? she has one ovary  ? REDUCTION MAMMAPLASTY    ? ? ?OB History   ?No obstetric history on file. ?  ? ? ? ?Home Medications   ? ?Prior to  Admission medications   ?Medication Sig Start Date End Date Taking? Authorizing Provider  ?b complex vitamins capsule Take 1 capsule by mouth daily.    [provider]  ?Multiple Vitamin (MULTIVITAMIN) tablet Take 1 tablet by mouth daily.    [provider]  ?NON FORMULARY Heal n soothe (systemic enzyme formula)    [provider]  ?NON FORMULARY Colon support    [provider]  ?NON FORMULARY daily. Multi collagen    [provider]  ?NON FORMULARY daily. Nitric oxide sanguenol    [provider]  ?NON FORMULARY Bioactive liver rejuvenix liver support formula dietary supplement ?Patient not taking: Reported on 11/06/2020    [provider]  ?NON FORMULARY Fruits and veggies    [provider]  ?Turmeric 400 MG CAPS Take by mouth daily.    [provider]  ?VITAMIN K PO Take by mouth. ?Patient not taking: Reported on 11/06/2020    [provider]  ? ? ?Family History ?Family History  ?Adopted: Yes  ?Problem Relation Age of Onset  ? Cancer Mother   ? ? ?Social History ?Social History  ? ?Tobacco Use  ? Smoking status: Former  ?  Types: Cigarettes  ? Smokeless tobacco: Never  ? Tobacco comments:  ?  quit 40 years ago  ?Vaping Use  ? Vaping Use: Never used  ?Substance Use Topics  ? Alcohol use: Not Currently  ? Drug use: Not Currently  ? ? ? ?  Allergies   ?Ciprofloxacin, Duloxetine, Ezetimibe, Hydroxyzine, Paroxetine, and Statins ? ? ?Review of Systems ?Review of Systems  ?Constitutional:  Positive for fever. Negative for activity change, appetite change, chills, diaphoresis, fatigue and unexpected weight change.  ?HENT:  Positive for congestion and rhinorrhea. Negative for dental problem, drooling, ear discharge, ear pain, facial swelling, hearing loss, mouth sores, nosebleeds, postnasal drip, sinus pressure, sinus pain, sneezing, sore throat, tinnitus, trouble swallowing and voice change.   ?Respiratory:  Positive for cough and  shortness of breath. Negative for apnea, choking, chest tightness, wheezing and stridor.   ?Cardiovascular: Negative.   ?Skin: Negative.   ? ? ?Physical Exam ?Triage Vital Signs ?ED Triage Vitals  ?Enc Vitals Group  ?   BP 01/14/22 0905 (!) 148/77  ?   Pulse Rate 01/14/22 0905 66  ?   Resp 01/14/22 0905 18  ?   Temp 01/14/22 0905 98 ?F (36.7 ?C)  ?   Temp Source 01/14/22 0905 Temporal  ?   SpO2 01/14/22 0905 96 %  ?   Weight --   ?   Height --   ?   Head Circumference --   ?   Peak Flow --   ?   Pain Score 01/14/22 0906 5  ?   Pain Loc --   ?   Pain Edu? --   ?   Excl. in [place]? --   ? ?No data found. ? ?Updated Vital Signs ?BP (!) 148/77 (BP Location: Left Arm)   Pulse 66   Temp 98 ?F (36.7 ?C) (Temporal)   Resp 18   SpO2 96%  ? ?Visual Acuity ?Right Eye Distance:   ?Left Eye Distance:   ?Bilateral Distance:   ? ?Right Eye Near:   ?Left Eye Near:    ?Bilateral Near:    ? ?Physical Exam ?Constitutional:   ?   Appearance: Normal appearance.  ?HENT:  ?   Head: Normocephalic.  ?   Right Ear: Tympanic membrane, ear canal and external ear normal.  ?   Left Ear: Tympanic membrane, ear canal and external ear normal.  ?   Nose: Nose normal.  ?   Mouth/Throat:  ?   Mouth: Mucous membranes are moist.  ?   Pharynx: Oropharynx is clear.  ?Eyes:  ?   Extraocular Movements: Extraocular movements intact.  ?Cardiovascular:  ?   Rate and Rhythm: Normal rate and regular rhythm.  ?   Pulses: Normal pulses.  ?   Heart sounds: Normal heart sounds.  ?Pulmonary:  ?   Effort: Pulmonary effort is normal.  ?   Breath sounds: Rhonchi present.  ?Abdominal:  ?   General: Abdomen is flat. Bowel sounds are normal. There is no distension.  ?   Palpations: Abdomen is soft.  ?   Tenderness: There is no abdominal tenderness. There is no right CVA tenderness or left CVA tenderness.  ?Musculoskeletal:  ?   Cervical back: Normal range of motion and neck supple.  ?Skin: ?   General: Skin is warm and dry.  ?Neurological:  ?   Mental Status: She is alert  and oriented to person, place, and time. Mental status is at baseline.  ?Psychiatric:     ?   Mood and Affect: Mood normal.     ?   Behavior: Behavior normal.  ? ? ? ?UC Treatments / Results  ?Labs ?(all labs ordered are listed, but only abnormal results are displayed) ?Labs Reviewed - No data to display ? ?EKG ? ? ?  Radiology ?No results found. ? ?Procedures ?Procedures (including critical care time) ? ?Medications Ordered in UC ?Medications - No data to display ? ?Initial Impression / Assessment and Plan / UC Course  ?I have reviewed the triage vital signs and the nursing notes. ? ?Pertinent labs & imaging results that were available during my care of the patient were reviewed by me and considered in my medical decision making (see chart for details). ? ?Clinical Course as of 01/14/22 1023  ?Wed Jan 14, 2022  ?0958 Bacteria, UA(!): FEW [AW]  ?  ?Clinical Course User Index ?[AW] Hans Eden, NP  ? ? ?COPD exacerbation ?Acute cystitis without hematuria ? ?Vital signs are stable, O2 saturation 96% on room air, rhonchi heard to auscultation, chest x-ray negative for infection, showing COPD, discussed findings with patient, COPD is most likely flared due to viral exposure and seasonal pollen as patient endorses that she has never had an exacerbation of her COPD, urinalysis showing Lorea Kupfer blood cells and bacteria under the microscope, will move forward with antibiotic treatment prescribed Augmentin for coverage of dual coverage, discussed with patient, prescribed prednisone for additional support, may continue use of over-the-counter medications as needed, advised increase fluid intake through use of water and good hygiene, patient given strict precautions to follow-up with PCP or urgent care for persisting symptoms ?Final Clinical Impressions(s) / UC Diagnoses  ? ?Final diagnoses:  ?None  ? ?Discharge Instructions   ?None ?  ? ?ED Prescriptions   ?None ?  ? ?PDMP not reviewed this encounter. ?  ?Hans Eden,  NP ?01/14/22 1025 ? ?

## 2022-01-16 LAB — URINE CULTURE

## 2022-01-20 IMAGING — RF DG UGI W/ HIGH DENSITY W/O KUB
12 of 19 series · 14 of 24 positions shown · non-contrast
Comparison: None.

CLINICAL DATA: Hiccups, Burping, Feeling full after eating

EXAM:
UPPER GI SERIES WITHOUT KUB
TECHNIQUE: Routine upper GI series was performed with thin and thick barium.
FLUOROSCOPY TIME:  Fluoroscopy Time: 1 min 24 sec
Radiation Exposure Index (if provided by the fluoroscopic device):
6.8 mGy
Number of Acquired Spot Images: 0

[Series 1: cp_standard · 0.26mm/px · 1 of 1 slices shown (1 of 12)]
[im 1/1]
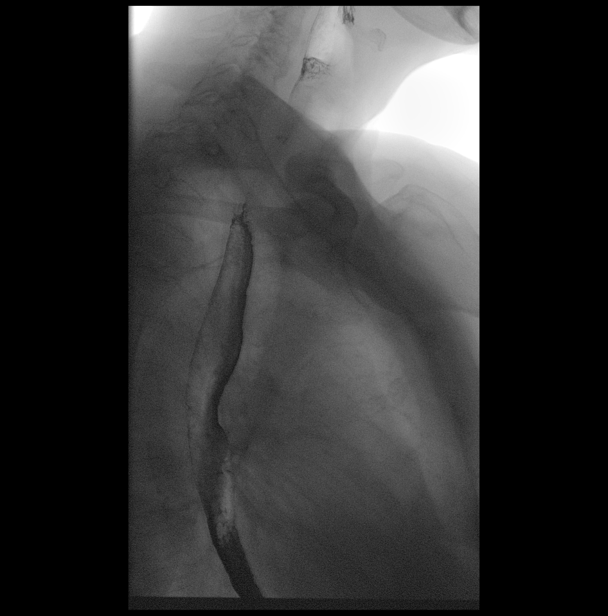

[Series 4: cp_standard · 0.26mm/px · 2 of 15 frames shown (2 of 12)]
[frame 3/15]
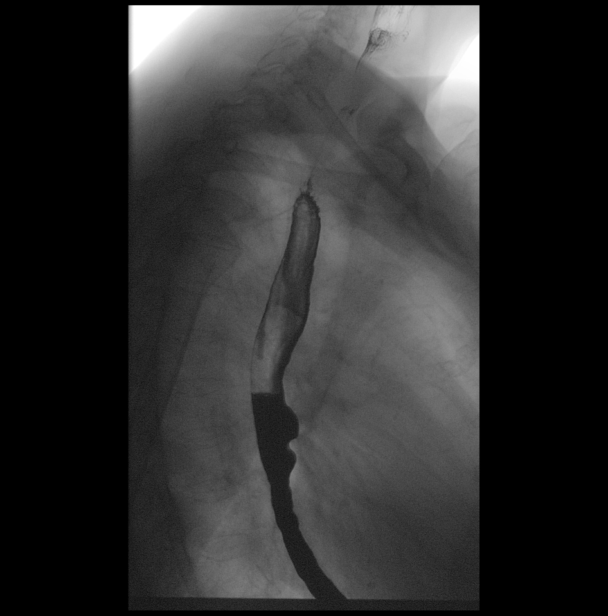
[frame 11/15]
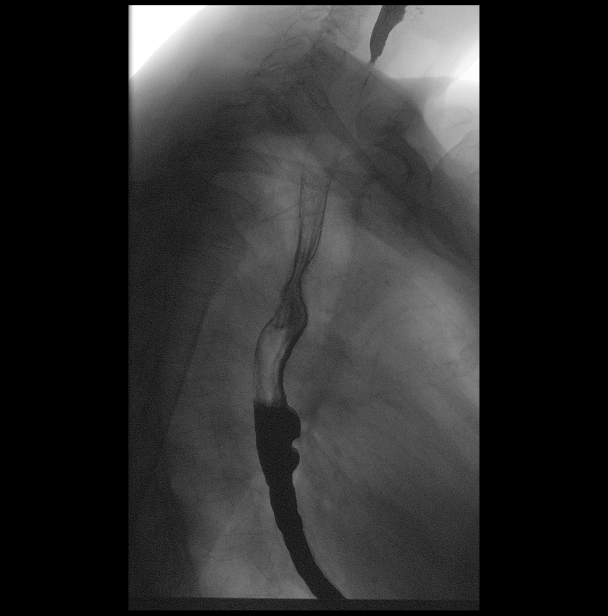

[Series 6: cp_standard · 0.26mm/px · 1 of 1 slices shown (3 of 12)]
[im 1/1]
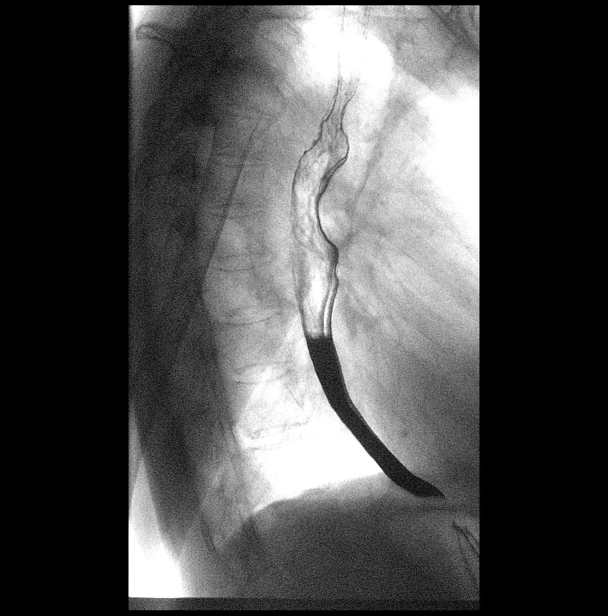

[Series 7: cp_standard · 0.26mm/px · 1 of 12 frames shown (4 of 12)]
[frame 2/12]
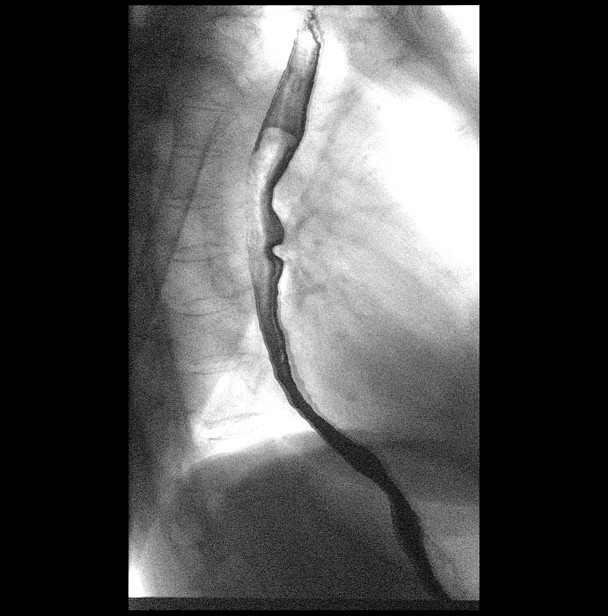

[Series 8: cp_standard · 0.26mm/px · 2 of 44 frames shown (5 of 12)]
[frame 7/44]
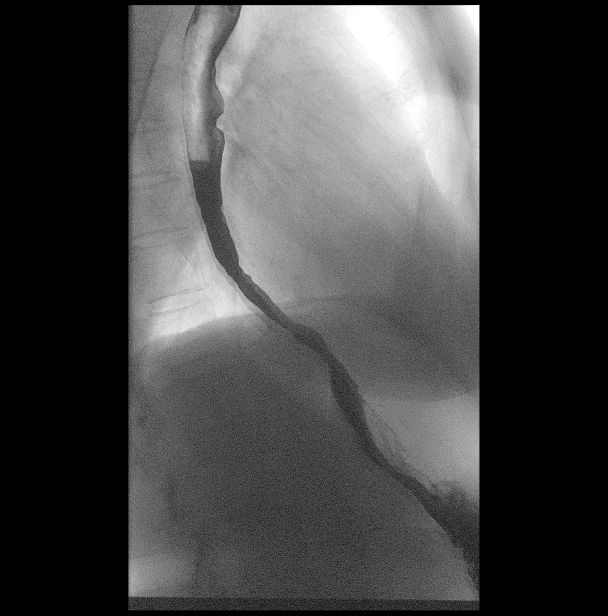
[frame 43/44]
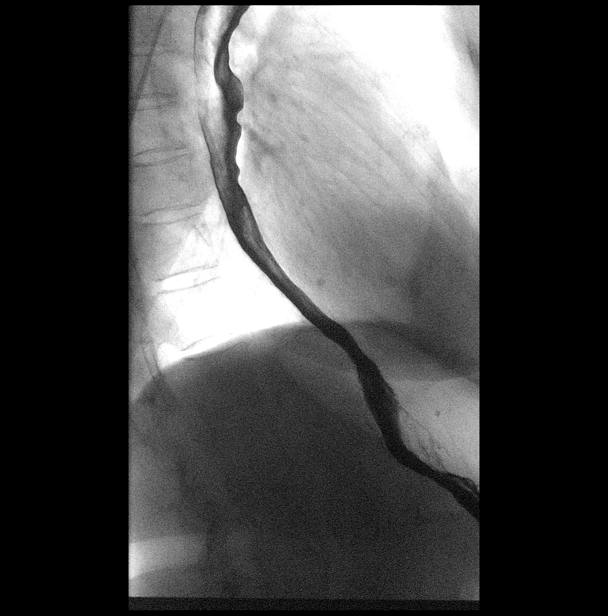

[Series 9: cp_standard · 0.28mm/px · 1 of 1 slices shown (6 of 12)]
[im 1/1]
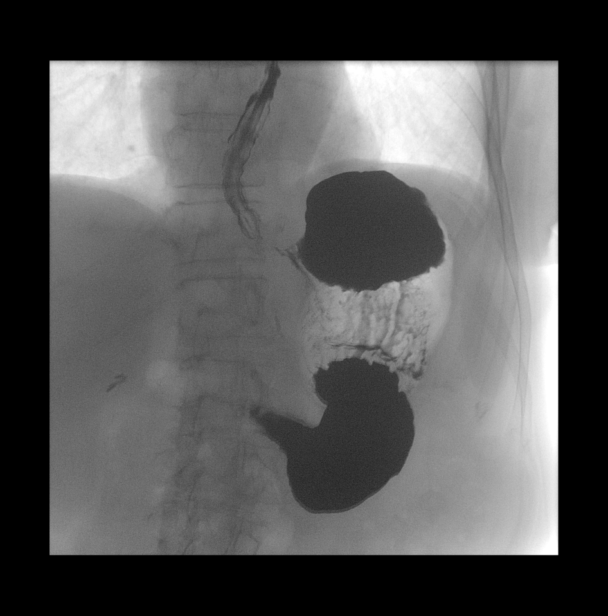

[Series 12: cp_standard · 0.29mm/px · 1 of 1 slices shown (7 of 12)]
[im 1/1]
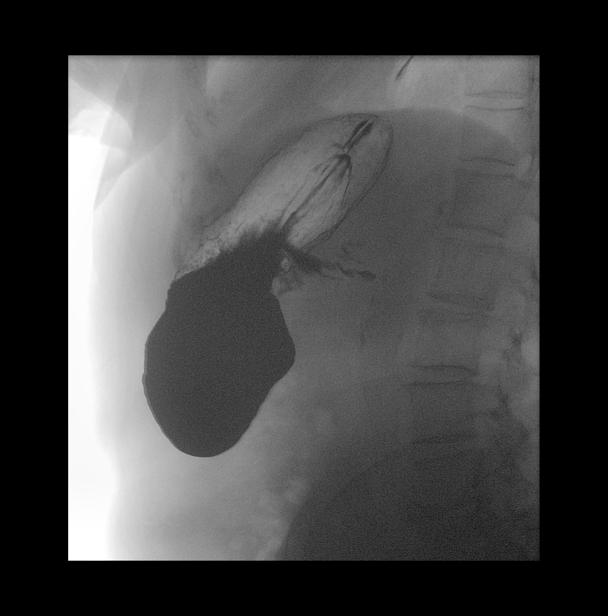

[Series 15: cp_standard · 0.29mm/px · 1 of 1 slices shown (8 of 12)]
[im 1/1]
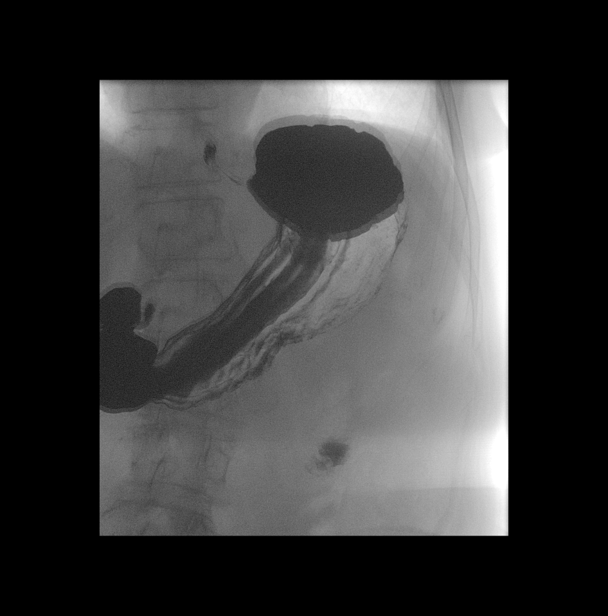

[Series 17: cp_standard · 0.30mm/px · 1 of 1 slices shown (9 of 12)]
[im 1/1]
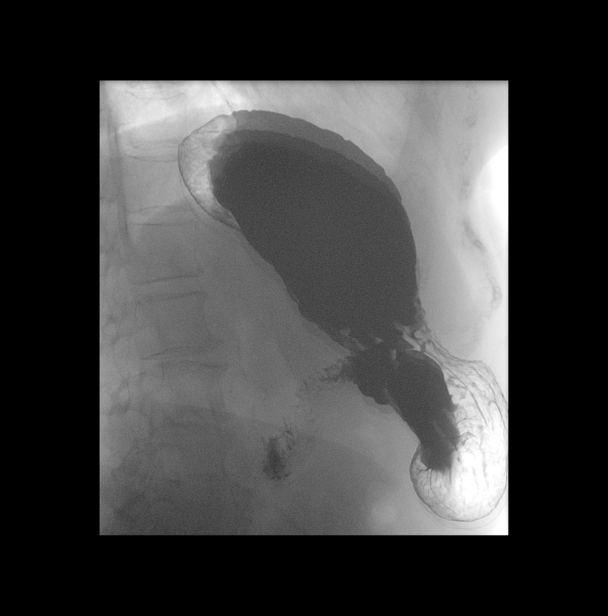

[Series 19: cp_standard · 0.30mm/px · 1 of 1 slices shown (10 of 12)]
[im 1/1]
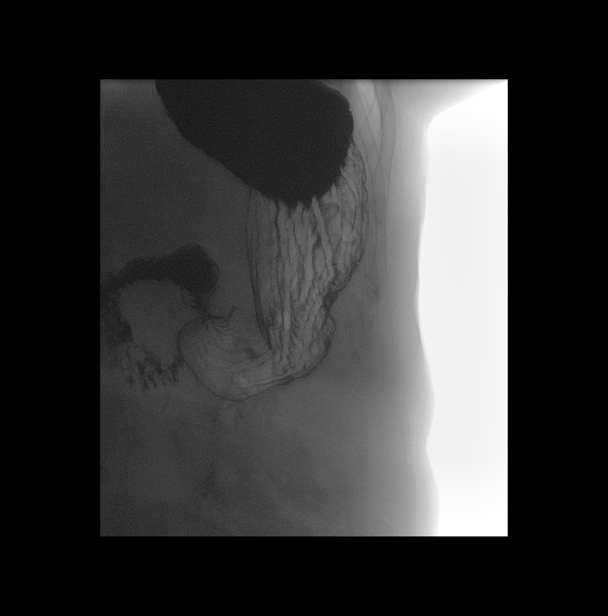

[Series 21: cp_standard · 0.30mm/px · 1 of 1 slices shown (11 of 12)]
[im 1/1]
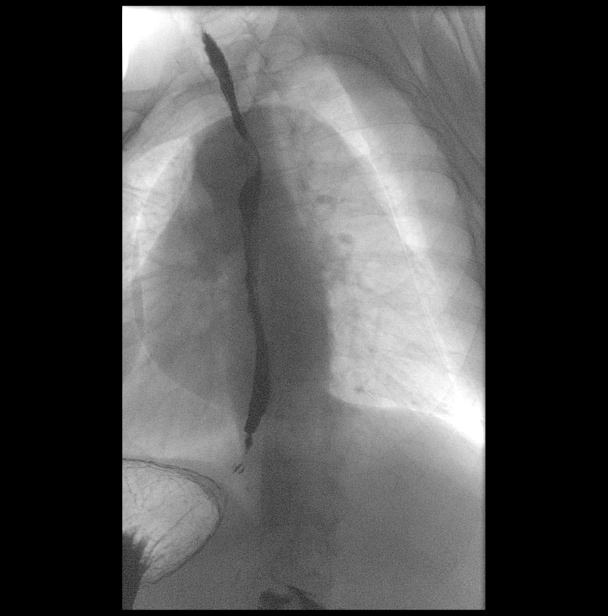

[Series 24: cp_standard · 0.30mm/px · 1 of 1 slices shown (12 of 12)]
[im 1/1]
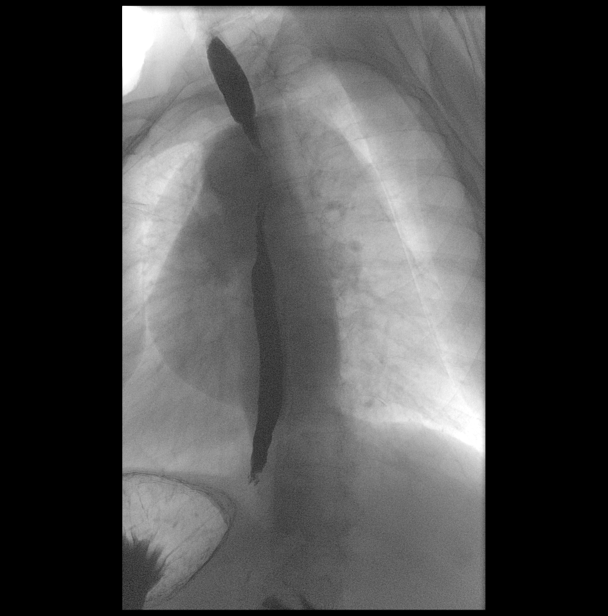

[14 of 24 positions shown; findings below may reference images not displayed]

FINDINGS: Examination of the esophagus demonstrated normal esophageal
morphology without evidence of esophagitis or ulceration. Tertiary
contractions of the distal third of the esophagus as can be seen
with mild spasm. Distal esophageal stricture just proximal to the
esophagogastric junction restricting the passage of a 13 mm barium
tablet. No esophageal stricture, diverticula, or mass lesion. No
evidence of hiatal hernia. No spontaneous or inducible
gastroesophageal reflux.

Examination of the stomach demonstrated normal rugal folds and areae
gastricae. The gastric mucosa appeared unremarkable without evidence
of ulceration, scarring, or mass lesion. Gastric motility and
emptying was normal. Fluoroscopic examination of the duodenum
demonstrates normal motility and morphology without evidence of
ulceration or mass lesion.
IMPRESSION: Tertiary contractions of the distal third of the esophagus as can be
seen with mild spasm.

Mild distal esophageal stricture just proximal to the
esophagogastric junction restricting the passage of a 13 mm barium
tablet.

## 2022-03-31 ENCOUNTER — Other Ambulatory Visit: Payer: Self-pay | Admitting: Gastroenterology

## 2022-03-31 DIAGNOSIS — R300 Dysuria: Secondary | ICD-10-CM

## 2022-03-31 DIAGNOSIS — R1084 Generalized abdominal pain: Secondary | ICD-10-CM

## 2022-03-31 DIAGNOSIS — R198 Other specified symptoms and signs involving the digestive system and abdomen: Secondary | ICD-10-CM

## 2022-04-13 ENCOUNTER — Ambulatory Visit
Admission: RE | Admit: 2022-04-13 | Discharge: 2022-04-13 | Disposition: A | Discharge status: Home / Self Care | Payer: Medicare HMO | Source: Ambulatory Visit | Attending: Gastroenterology | Admitting: Gastroenterology

## 2022-04-13 DIAGNOSIS — R198 Other specified symptoms and signs involving the digestive system and abdomen: Secondary | ICD-10-CM | POA: Insufficient documentation

## 2022-04-13 DIAGNOSIS — R1084 Generalized abdominal pain: Secondary | ICD-10-CM | POA: Diagnosis present

## 2022-04-13 DIAGNOSIS — R300 Dysuria: Secondary | ICD-10-CM | POA: Insufficient documentation

## 2022-04-13 MED ORDER — IOHEXOL 300 MG/ML  SOLN
100.0000 mL | Freq: Once | INTRAMUSCULAR | Status: AC | PRN
  Administered 2022-04-13: 100 mL via INTRAVENOUS

## 2023-02-13 ENCOUNTER — Encounter: Payer: Self-pay | Admitting: Intensive Care

## 2023-02-13 ENCOUNTER — Emergency Department
Admission: EM | Admit: 2023-02-13 | Discharge: 2023-02-14 | Disposition: A | Discharge status: Home / Self Care | Payer: Medicare HMO | Attending: Emergency Medicine | Admitting: Emergency Medicine

## 2023-02-13 ENCOUNTER — Emergency Department: Payer: Medicare HMO

## 2023-02-13 ENCOUNTER — Other Ambulatory Visit: Payer: Self-pay

## 2023-02-13 DIAGNOSIS — R0602 Shortness of breath: Secondary | ICD-10-CM | POA: Insufficient documentation

## 2023-02-13 LAB — COMPREHENSIVE METABOLIC PANEL
ALT: 12 U/L (ref 0–44)
AST: 17 U/L (ref 15–41)
Albumin: 4.7 g/dL (ref 3.5–5.0)
Alkaline Phosphatase: 70 U/L (ref 38–126)
Anion gap: 9 (ref 5–15)
BUN: 14 mg/dL (ref 8–23)
CO2: 21 mmol/L — ABNORMAL LOW (ref 22–32)
Calcium: 9.6 mg/dL (ref 8.9–10.3)
Chloride: 106 mmol/L (ref 98–111)
Creatinine, Ser: 0.66 mg/dL (ref 0.44–1.00)
GFR, Estimated: >60 mL/min (ref >60)
Glucose, Bld: 101 mg/dL — ABNORMAL HIGH (ref 70–99)
Potassium: 3.7 mmol/L (ref 3.5–5.1)
Sodium: 136 mmol/L (ref 135–145)
Total Bilirubin: 1.1 mg/dL (ref 0.3–1.2)
Total Protein: 7.3 g/dL (ref 6.5–8.1)

## 2023-02-13 LAB — URINALYSIS, ROUTINE W REFLEX MICROSCOPIC
Bilirubin Urine: NEGATIVE
Glucose, UA: NEGATIVE mg/dL
Hgb urine dipstick: NEGATIVE
Ketones, ur: NEGATIVE mg/dL
Leukocytes,Ua: NEGATIVE
Nitrite: NEGATIVE
Protein, ur: NEGATIVE mg/dL
Specific Gravity, Urine: 1.014 (ref 1.005–1.030)
pH: 5 (ref 5.0–8.0)

## 2023-02-13 LAB — CBC WITH DIFFERENTIAL/PLATELET
Abs Immature Granulocytes: 0.03 10*3/uL (ref 0.00–0.07)
Basophils Absolute: 0 10*3/uL (ref 0.0–0.1)
Basophils Relative: 0 %
Eosinophils Absolute: 0.1 10*3/uL (ref 0.0–0.5)
Eosinophils Relative: 1 %
HCT: 45.4 % (ref 36.0–46.0)
Hemoglobin: 14.8 g/dL (ref 12.0–15.0)
Immature Granulocytes: 0 %
Lymphocytes Relative: 26 %
Lymphs Abs: 1.8 10*3/uL (ref 0.7–4.0)
MCH: 29.8 pg (ref 26.0–34.0)
MCHC: 32.6 g/dL (ref 30.0–36.0)
MCV: 91.3 fL (ref 80.0–100.0)
Monocytes Absolute: 0.6 10*3/uL (ref 0.1–1.0)
Monocytes Relative: 9 %
Neutro Abs: 4.5 10*3/uL (ref 1.7–7.7)
Neutrophils Relative %: 64 %
Platelets: 301 10*3/uL (ref 150–400)
RBC: 4.97 MIL/uL (ref 3.87–5.11)
RDW: 12.7 % (ref 11.5–15.5)
WBC: 7 10*3/uL (ref 4.0–10.5)
nRBC: 0 % (ref 0.0–0.2)

## 2023-02-13 NOTE — ED Triage Notes (Signed)
Patient c/o sob, headaches, and dizziness. Reports she believes she has carbon dioxide over exposure  Reports 3 months ago had her regulator fixed on her generator. Then started smelling gas in her basement. The company came again to fix the generator a second time.

## 2023-02-13 NOTE — ED Provider Notes (Signed)
[facility] Provider Note   Event Date/Time   First MD Initiated Contact with Patient 02/13/23 1157     (approximate) History  Shortness of Breath  HPI Stacy Brewer is a 82 y.o. female who reportedly presents to the emergency department for shortness of breath with concerns for possible carboxyhemoglobinemia due to a "generator causing a gas smell in the basement".  Prior to my assessment, patient requested to leave AGAINST MEDICAL ADVICE.  Further history and review of systems are unable to be obtained at this time ROS: Unable to assess   Physical Exam  Triage Vital Signs: ED Triage Vitals [02/13/23 1042]  Enc Vitals Group     BP (!) 169/96     Pulse Rate 70     Resp 18     Temp 98.2 F (36.8 C)     Temp Source Oral     SpO2 97 %     Weight 130 lb (59 kg)     Height 5' 4.5" (1.638 m)     Head Circumference      Peak Flow      Pain Score 8     Pain Loc      Pain Edu?      Excl. in [place]?    Most recent vital signs: Vitals:   02/13/23 1042 02/13/23 1245  BP: (!) 169/96   Pulse: 70 67  Resp: 18 17  Temp: 98.2 F (36.8 C)   SpO2: 97% 100%   General: Awake CV:  Good peripheral perfusion.  Resp:  Normal effort.  Abd:  No distention.  Other:  Elderly Caucasian female laying in bed in no acute distress ED Results / Procedures / Treatments  Labs (all labs ordered are listed, but only abnormal results are displayed) Labs Reviewed  COMPREHENSIVE METABOLIC PANEL - Abnormal; Notable for the following components:      Result Value   CO2 21 (*)    Glucose, Bld 101 (*)    All other components within normal limits  URINALYSIS, ROUTINE W REFLEX MICROSCOPIC - Abnormal; Notable for the following components:   Color, Urine YELLOW (*)    APPearance HAZY (*)    All other components within normal limits  CBC WITH DIFFERENTIAL/PLATELET  COOXEMETRY PANEL   EKG ED ECG REPORT I, Merwyn Katos, the attending physician, personally viewed and  interpreted this ECG. Date: 02/13/2023 EKG Time: 1040 Rate: 66 Rhythm: normal sinus rhythm QRS Axis: normal Intervals: normal ST/T Wave abnormalities: normal Narrative Interpretation: no evidence of acute ischemia RADIOLOGY ED MD interpretation: 2 view chest x-ray interpreted by me shows no evidence of acute abnormalities including no pneumonia, pneumothorax, or widened mediastinum -Agree with radiology assessment Official radiology report(s): DG Chest 2 View  Result Date: 02/13/2023 CLINICAL DATA:  Short of breath, headaches and dizziness. EXAM: CHEST - 2 VIEW COMPARISON:  01/14/2022. FINDINGS: Cardiac silhouette is normal in size. No mediastinal or hilar masses or evidence of adenopathy. Lungs are hyperexpanded, but clear. No pleural effusion or pneumothorax. Skeletal structures are intact. IMPRESSION: 1. No acute cardiopulmonary disease. Electronically Signed   By: Amie Portland M.D.   On: 02/13/2023 11:09   PROCEDURES: Critical Care performed: No Procedures MEDICATIONS ORDERED IN ED: Medications - No data to display IMPRESSION / MDM / ASSESSMENT AND PLAN / ED COURSE  I reviewed the triage vital signs and the nursing notes.  The patient is on the cardiac monitor to evaluate for evidence of arrhythmia and/or significant heart rate changes. Patient's presentation is most consistent with acute presentation with potential threat to life or bodily function. The patient is suffering from shortness of breath, but the immediate cause is not apparent.  Potential causes considered include, but are not limited to, asthma or COPD, congestive heart failure, pulmonary embolism, pneumothorax, coronary syndrome, pneumonia, and pleural effusion.  Despite the evaluation including history, exam, and testing, the cause of the shortness of breath remains unclear. However, during the ED stay, patient shows no signs of hypoxia or respiratory distress and at the time of discharge  the patient is in no respiratory distress  While awaiting results of patient's Co. oximetry panel, she requested to leave AGAINST MEDICAL ADVICE.  This patient has elected to leave against medical advice. In my opinion, the patient has capacity to leave AMA. The patient is clinically sober, free from distracting injury, appears to have intact insight and judgment and reason, and in my opinion has capacity to make decisions. I explained to the patient that his symptoms may represent carboxyhemoglobinemia and the patient reportedly shared these concerns  The patient is refusing any further care, specifically results of her cooximeter panel, and is leaving against medical advice. I am unable to convince the patient to stay. I have asked them to return as soon as possible to complete their evaluation, and also explained that they were welcome to return to the ER for further evaluation whenever they choose. I have asked the patient to follow up with their primary doctor as soon as possible. I have answered all their questions. Patient did not sign AMA paperwork.  Dispo: AMA    FINAL CLINICAL IMPRESSION(S) / ED DIAGNOSES   Final diagnoses:  SOB (shortness of breath)   Rx / DC Orders   ED Discharge Orders     None      Note:  This document was prepared using Dragon voice recognition software and may include unintentional dictation errors.   Merwyn Katos, MD 02/14/23 [phone]

## 2023-02-13 NOTE — ED Notes (Signed)
Pt Aox4, no difficulty breathing, pt does state has had a headache that comes and goes over the last 3 weeks.

## 2023-04-27 ENCOUNTER — Ambulatory Visit (INDEPENDENT_AMBULATORY_CARE_PROVIDER_SITE_OTHER): Payer: Medicare HMO | Admitting: Internal Medicine

## 2023-04-27 ENCOUNTER — Ambulatory Visit
Admission: RE | Admit: 2023-04-27 | Discharge: 2023-04-27 | Disposition: A | Discharge status: Home / Self Care | Payer: Medicare HMO | Source: Ambulatory Visit | Attending: Internal Medicine | Admitting: Internal Medicine

## 2023-04-27 ENCOUNTER — Encounter: Payer: Self-pay | Admitting: Internal Medicine

## 2023-04-27 VITALS — BP 142/86 | HR 56 | Temp 97.9°F | Resp 16 | Ht 64.5 in | Wt 133.4 lb

## 2023-04-27 DIAGNOSIS — R5382 Chronic fatigue, unspecified: Secondary | ICD-10-CM

## 2023-04-27 DIAGNOSIS — M419 Scoliosis, unspecified: Secondary | ICD-10-CM | POA: Insufficient documentation

## 2023-04-27 DIAGNOSIS — R768 Other specified abnormal immunological findings in serum: Secondary | ICD-10-CM | POA: Diagnosis not present

## 2023-04-27 DIAGNOSIS — K582 Mixed irritable bowel syndrome: Secondary | ICD-10-CM

## 2023-04-27 DIAGNOSIS — M2559 Pain in other specified joint: Secondary | ICD-10-CM

## 2023-04-27 DIAGNOSIS — E559 Vitamin D deficiency, unspecified: Secondary | ICD-10-CM

## 2023-04-27 DIAGNOSIS — J449 Chronic obstructive pulmonary disease, unspecified: Secondary | ICD-10-CM

## 2023-04-27 MED ORDER — ALBUTEROL SULFATE HFA 108 (90 BASE) MCG/ACT IN AERS: 2.0000 | INHALATION_SPRAY | Freq: Four times a day (QID) | RESPIRATORY_TRACT | 0 refills | Status: DC | PRN

## 2023-04-27 NOTE — Progress Notes (Signed)
New Patient Office Visit  Subjective    Patient ID: Stacy Brewer, female    DOB: 02/27/1941  Age: 82 y.o. MRN: [id number]  CC:  Chief Complaint  Patient presents with   Establish Care   Fatigue    Nausea, vomiting, diarrhea-see's dietician and if works on diet she is better.   No energy   Arthritis    Scoliosis  and arthritis    HPI Stacy Brewer presents to establish care.  Hx of PAF/CAD: -Not currently on medications, history of ablation -Does not have a Cardiologist -EKG 02/13/23 sinus rhythm with occasional PVC's -Denies shortness of breath, chest pain or palpitations  History of Hyperthyroidism: -In childhood was treated with iodine  -Last TSH checked last year was normal -Symptoms include ongoing chronic fatigue  COPD/Allergies: -Former smoker, quit several years ago -COPD status: stable -Current medications: None -Oxygen use: no -Dyspnea frequency: Occasionally with activity  -Cough frequency: Rarely -Rescue inhaler frequency: Does not have one -Limitation of activity: no -Productive cough: No -Last Spirometry/PFTs: 2020 -Pneumovax: Not up to Date -patient has a history of long COVID and has not had any vaccine since 2020 -Influenza: Not up to Date -Allergies worse in the spring and fall, will take a Benadryl as needed occasionally  Scoliosis/arthritis: -Had lower back pain chronically for many years -Does different stretches and exercises at home which does help, patient cannot take anti-inflammatories due to GI side effects.  She does take Tylenol occasionally which does help -Arthritis pain is severe in the multiple joints, did have a positive ANA years ago but negative inflammatory markers and RF  Chronic nausea, vomiting and diarrhea: -Has an appointment with the dietitian but right now is following some online program where she cannot eat certain food groups together.  If she is able to avoid these foods she does not have symptoms -Patient  also notes change in stool caliber, however patient is not interested in pursuing colon cancer screening or colonoscopy  Health Maintenance: -Blood work due  -Mammograms: patient declines further screenings -Colon cancer screening: Patient declines further screening -Patient not interested in any immunizations   Outpatient Encounter Medications as of 04/27/2023  Medication Sig   Multiple Vitamin (MULTIVITAMIN) tablet Take 1 tablet by mouth daily.   NON FORMULARY Colon support   NON FORMULARY daily. Nitric oxide sanguenol   Turmeric 400 MG CAPS Take by mouth daily.   [DISCONTINUED] amoxicillin-clavulanate (AUGMENTIN) 875-125 MG tablet Take 1 tablet by mouth every 12 (twelve) hours.   [DISCONTINUED] b complex vitamins capsule Take 1 capsule by mouth daily.   [DISCONTINUED] NON FORMULARY Heal n soothe (systemic enzyme formula)   [DISCONTINUED] NON FORMULARY daily. Multi collagen   [DISCONTINUED] NON FORMULARY Bioactive liver rejuvenix liver support formula dietary supplement (Patient not taking: Reported on 11/06/2020)   [DISCONTINUED] NON FORMULARY Fruits and veggies   [DISCONTINUED] predniSONE (DELTASONE) 20 MG tablet Take 2 tablets (40 mg total) by mouth daily.   [DISCONTINUED] VITAMIN K PO Take by mouth. (Patient not taking: Reported on 11/06/2020)   No facility-administered encounter medications on file as of 04/27/2023.    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   Ulcer    none recent    Past Surgical History:  Procedure Laterality Date   ABDOMINAL HYSTERECTOMY     CARDIAC SURGERY     corrected Afib   CHOLECYSTECTOMY     COSMETIC SURGERY     EYE SURGERY      HYSTERECTOMY ABDOMINAL WITH SALPINGECTOMY     she has one ovary   REDUCTION MAMMAPLASTY      Family History  Adopted: Yes  Problem Relation Age of Onset   Cancer Mother    ADD / ADHD Mother    Alcohol abuse Mother    Anxiety disorder Mother     Social History   Socioeconomic History   Marital status: Widowed    Spouse name: Not on file   Number of children: Not on file   Years of education: Not on file   Highest education level: Not on file  Occupational History   Not on file  Tobacco Use   Smoking status: Former    Current packs/day: 0.00    Average packs/day: 1 pack/day for 15.0 years (15.0 ttl pk-yrs)    Types: Cigarettes    Quit date: 10/12/1988    Years since quitting: 34.5   Smokeless tobacco: Never   Tobacco comments:    first woman hired for [employer] co. had to smoke  Vaping Use   Vaping status: Never Used  Substance and Sexual Activity   Alcohol use: Yes    Alcohol/week: 4.0 standard drinks of alcohol    Types: 4 Glasses of wine per week   Drug use: Never   Sexual activity: Not Currently  Other Topics Concern   Not on file  Social History Narrative   Not on file   Social Determinants of Health   Financial Resource Strain: Low Risk  (03/04/2021)   Received from [facility], [facility]   Overall Financial Resource Strain (CARDIA)    Difficulty of Paying Living Expenses: Not very hard  Food Insecurity: No Food Insecurity (03/04/2021)   Received from [facility], [facility]   Hunger Vital Sign    Worried About Running Out of Food in the Last Year: Never true    Ran Out of Food in the Last Year: Never true  Transportation Needs: No Transportation Needs (03/04/2021)   Received from [facility], [facility]   [facility] - Transportation    Lack of Transportation (Medical): No    Lack of Transportation (Non-Medical): No  Physical Activity: Not on file  Stress: Not on file  Social Connections: Not on file  Intimate Partner Violence:  Not on file    Review of Systems  Constitutional:  Positive for malaise/fatigue. Negative for chills, fever and weight loss.  Respiratory:  Negative for cough, shortness of breath and wheezing.   Cardiovascular:  Negative for chest pain and palpitations.  Gastrointestinal:  Positive for diarrhea, nausea and vomiting. Negative for abdominal pain, blood in stool and melena.  Genitourinary:  Negative for dysuria and hematuria.  Musculoskeletal:  Positive for back pain and joint pain.        Objective    BP (!) 144/82   Pulse (!) 56   Temp 97.9 F (36.6 C)   Resp 16   Ht 5' 4.5" (1.638 m)   Wt 133 lb 6.4 oz (60.5 kg)  SpO2 97%   BMI 22.54 kg/m   Physical Exam Constitutional:      Appearance: Normal appearance.  HENT:     Head: Normocephalic and atraumatic.     Mouth/Throat:     Mouth: Mucous membranes are moist.     Pharynx: Oropharynx is clear.  Eyes:     Extraocular Movements: Extraocular movements intact.     Conjunctiva/sclera: Conjunctivae normal.     Pupils: Pupils are equal, round, and reactive to light.  Neck:     Comments: No thyromegaly Cardiovascular:     Rate and Rhythm: Normal rate and regular rhythm.  Pulmonary:     Effort: Pulmonary effort is normal.     Breath sounds: Normal breath sounds.  Musculoskeletal:     Cervical back: No tenderness.     Right lower leg: No edema.     Left lower leg: No edema.  Lymphadenopathy:     Cervical: No cervical adenopathy.  Skin:    General: Skin is warm and dry.  Neurological:     General: No focal deficit present.     Mental Status: She is alert. Mental status is at baseline.  Psychiatric:        Mood and Affect: Mood normal.        Behavior: Behavior normal.     Last CBC Lab Results  Component Value Date   WBC 7.0 02/13/2023   HGB 14.8 02/13/2023   HCT 45.4 02/13/2023   MCV 91.3 02/13/2023   MCH 29.8 02/13/2023   RDW 12.7 02/13/2023   PLT 301 02/13/2023   Last metabolic panel Lab Results   Component Value Date   GLUCOSE 101 (H) 02/13/2023   NA 136 02/13/2023   K 3.7 02/13/2023   CL 106 02/13/2023   CO2 21 (L) 02/13/2023   BUN 14 02/13/2023   CREATININE 0.66 02/13/2023   GFRNONAA >60 02/13/2023   CALCIUM 9.6 02/13/2023   PROT 7.3 02/13/2023   ALBUMIN 4.7 02/13/2023   BILITOT 1.1 02/13/2023   ALKPHOS 70 02/13/2023   AST 17 02/13/2023   ALT 12 02/13/2023   ANIONGAP 9 02/13/2023   Last lipids No results found for: "CHOL", "HDL", "LDLCALC", "LDLDIRECT", "TRIG", "CHOLHDL" Last hemoglobin A1c No results found for: "HGBA1C" Last thyroid functions No results found for: "TSH", "T3TOTAL", "T4TOTAL", "THYROIDAB" Last vitamin D No results found for: "25OHVITD2", "25OHVITD3", "VD25OH" Last vitamin B12 and Folate No results found for: "VITAMINB12", "FOLATE"      Assessment & Plan:   1. Scoliosis, unspecified scoliosis type, unspecified spinal region: Will send for x-rays to evaluate the degree of the curve, discussed potential physical therapy referral.  Patient does not want to see neurosurgery despite results.  - DG SCOLIOSIS EVAL COMPLETE SPINE 1 VIEW; Future  2. Positive ANA (antinuclear antibody)/Pain in other joint: History of positive ANA, will recheck along with inflammatory markers.  Patient does have arthritis, however she cannot tolerate anti-inflammatories.  Right now she is taking Tylenol as needed.    - C-reactive protein - Sed Rate (ESR) - Antinuclear Antib (ANA)  3. Chronic fatigue: Recheck thyroid.  - TSH  4. Chronic obstructive pulmonary disease, unspecified COPD type [facility]): Has occasional shortness of breath with exertion, will prescribe a rescue inhaler to use as needed.  - albuterol (VENTOLIN HFA) 108 (90 Base) MCG/ACT inhaler; Inhale 2 puffs into the lungs every 6 (six) hours as needed for wheezing or shortness of breath.  Dispense: 8 g; Refill: 0  5. Irritable bowel syndrome with both  constipation and diarrhea: Following some online program  about avoiding certain food groups which seems to be helping her.  She does have an appointment with a dietitian.  Uncertain why she is having such severe reactions to mixing food groups, will obtain screening labs and screen for celiac disease.  Patient had been worked up by GI in the past without any significant findings.  Patient had an issue with a colonoscopy in the past in [place] and is not interested in any further colon cancer screening.  - CBC w/Diff/Platelet - COMPLETE METABOLIC PANEL WITH GFR - Celiac Disease Panel  6. Vitamin D deficiency: Recheck vitamin D levels, not currently on supplements  - Vitamin D (25 hydroxy)  Return if symptoms worsen or fail to improve, for AWV.   Margarita Mail, DO

## 2023-04-28 LAB — EXTRA SPECIMEN

## 2023-04-29 ENCOUNTER — Ambulatory Visit: Payer: Self-pay | Admitting: Internal Medicine

## 2023-04-29 LAB — CBC WITH DIFFERENTIAL/PLATELET
Absolute Monocytes: 614 cells/uL (ref 200–950)
Basophils Absolute: 52 cells/uL (ref 0–200)
Basophils Relative: 0.7 %
Eosinophils Absolute: 141 cells/uL (ref 15–500)
Eosinophils Relative: 1.9 %
HCT: 45.6 % — ABNORMAL HIGH (ref 35.0–45.0)
Hemoglobin: 15.1 g/dL (ref 11.7–15.5)
Lymphs Abs: 2116 cells/uL (ref 850–3900)
MCH: 30.1 pg (ref 27.0–33.0)
MCHC: 33.1 g/dL (ref 32.0–36.0)
MCV: 90.8 fL (ref 80.0–100.0)
MPV: 11 fL (ref 7.5–12.5)
Monocytes Relative: 8.3 %
Neutro Abs: 4477 cells/uL (ref 1500–7800)
Neutrophils Relative %: 60.5 %
Platelets: 318 10*3/uL (ref 140–400)
RBC: 5.02 10*6/uL (ref 3.80–5.10)
RDW: 14 % (ref 11.0–15.0)
Total Lymphocyte: 28.6 %
WBC: 7.4 10*3/uL (ref 3.8–10.8)

## 2023-04-29 LAB — COMPLETE METABOLIC PANEL WITH GFR
AG Ratio: 2 (calc) (ref 1.0–2.5)
ALT: 12 U/L (ref 6–29)
AST: 13 U/L (ref 10–35)
Albumin: 4.7 g/dL (ref 3.6–5.1)
Alkaline phosphatase (APISO): 75 U/L (ref 37–153)
BUN: 12 mg/dL (ref 7–25)
CO2: 29 mmol/L (ref 20–32)
Calcium: 10.2 mg/dL (ref 8.6–10.4)
Chloride: 101 mmol/L (ref 98–110)
Creat: 0.72 mg/dL (ref 0.60–0.95)
Globulin: 2.3 g/dL (calc) (ref 1.9–3.7)
Glucose, Bld: 94 mg/dL (ref 65–99)
Potassium: 4.5 mmol/L (ref 3.5–5.3)
Sodium: 140 mmol/L (ref 135–146)
Total Bilirubin: 0.6 mg/dL (ref 0.2–1.2)
Total Protein: 7 g/dL (ref 6.1–8.1)
eGFR: 84 mL/min/{1.73_m2} (ref >=60)

## 2023-04-29 LAB — VITAMIN D 25 HYDROXY (VIT D DEFICIENCY, FRACTURES): Vit D, 25-Hydroxy: 51 ng/mL (ref 30–100)

## 2023-04-29 LAB — CELIAC DISEASE PANEL
(tTG) Ab, IgA: <1 U/mL
(tTG) Ab, IgG: <1 U/mL
Gliadin IgA: <1 U/mL
Gliadin IgG: <1 U/mL
Immunoglobulin A: 173 mg/dL (ref 70–320)

## 2023-04-29 LAB — ANA: Anti Nuclear Antibody (ANA): POSITIVE — AB

## 2023-04-29 LAB — ANTI-NUCLEAR AB-TITER (ANA TITER): ANA Titer 1: 1:80 {titer} — ABNORMAL HIGH

## 2023-04-29 LAB — TSH: TSH: 1.05 mIU/L (ref 0.40–4.50)

## 2023-04-29 LAB — SEDIMENTATION RATE: Sed Rate: 6 mm/h (ref 0–30)

## 2023-04-29 LAB — C-REACTIVE PROTEIN: CRP: <3 mg/L (ref <8.0)

## 2023-05-03 ENCOUNTER — Telehealth: Payer: Self-pay | Admitting: Internal Medicine

## 2023-05-12 ENCOUNTER — Other Ambulatory Visit: Payer: Self-pay | Admitting: Internal Medicine

## 2023-05-12 ENCOUNTER — Telehealth: Payer: Self-pay | Admitting: Internal Medicine

## 2023-05-12 DIAGNOSIS — M419 Scoliosis, unspecified: Secondary | ICD-10-CM

## 2023-05-12 NOTE — Telephone Encounter (Addendum)
Patient called in regards to her scoliosis, per patient she stated she had her XRAY done and she stated someone has tried reaching out to her and her son in regards to physical therapy for her scoliosis. Per patient she was supposed to be sent for physical therapy and she called earlier today, 7.31.2024 and was connected to [facility] by another agent, per [facility] told patient they have not received papers or an order and told them patient theycan not do anything without an order and let patient know there was a facility in [place] who has PT and patient stated she would really like to go there if possible. Patient would like a call back in regards to this to figure out what is going on and where she needs to be going or calling.   Patients callback # [phone]   *Patient stated she takes her phone off of the hook from 2-4pm and will not answer or get the call between these times, please advise.*

## 2023-05-12 NOTE — Telephone Encounter (Signed)
Per X-Ray result- pt is interested in PT. Please advice

## 2023-05-20 ENCOUNTER — Ambulatory Visit: Payer: Medicare HMO

## 2023-05-21 ENCOUNTER — Ambulatory Visit (INDEPENDENT_AMBULATORY_CARE_PROVIDER_SITE_OTHER): Payer: Medicare HMO

## 2023-05-21 VITALS — Ht 64.5 in | Wt 133.0 lb

## 2023-05-21 DIAGNOSIS — Z0000 Encounter for general adult medical examination without abnormal findings: Secondary | ICD-10-CM | POA: Diagnosis not present

## 2023-05-21 NOTE — Progress Notes (Signed)
Subjective:   Stacy Brewer is a 82 y.o. female who presents for an Initial Medicare Annual Wellness Visit.  Visit Complete: Virtual  I connected with  Doreene Burke on 05/21/23 by a audio enabled telemedicine application and verified that I am speaking with the correct person using two identifiers.  Patient Location: Home  Provider Location: Home Office  I discussed the limitations of evaluation and management by telemedicine. The patient expressed understanding and agreed to proceed.  Vital Signs: Unable to obtain new vitals due to this being a telehealth visit.  Patient Medicare AWV questionnaire was completed by the patient on (not done); I have confirmed that all information answered by patient is correct and no changes since this date.  Review of Systems    Cardiac Risk Factors include: advanced age (>47men, >55 women);dyslipidemia    Objective:    Today's Vitals   05/21/23 1112  Weight: 133 lb (60.3 kg)  Height: 5' 4.5" (1.638 m)   Body mass index is 22.48 kg/m.     05/21/2023   11:33 AM 02/13/2023   10:45 AM 01/14/2022    9:08 AM 11/06/2020    2:18 PM  Advanced Directives  Does Patient Have a Medical Advance Directive? Yes No Yes Yes  Type of Estate agent of [place];Living will   Healthcare Power of Attorney  Would patient like information on creating a medical advance directive?  No - Patient declined      Current Medications (verified) Outpatient Encounter Medications as of 05/21/2023  Medication Sig   albuterol (VENTOLIN HFA) 108 (90 Base) MCG/ACT inhaler Inhale 2 puffs into the lungs every 6 (six) hours as needed for wheezing or shortness of breath.   Multiple Vitamin (MULTIVITAMIN) tablet Take 1 tablet by mouth daily.   NON FORMULARY Colon support   NON FORMULARY daily. Nitric oxide sanguenol   Turmeric 400 MG CAPS Take by mouth daily.   No facility-administered encounter medications on file as of 05/21/2023.    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   Ulcer    none recent   Past Surgical History:  Procedure Laterality Date   ABDOMINAL HYSTERECTOMY     CARDIAC SURGERY     corrected Afib   CHOLECYSTECTOMY     COSMETIC SURGERY     EYE SURGERY     HYSTERECTOMY ABDOMINAL WITH SALPINGECTOMY     she has one ovary   REDUCTION MAMMAPLASTY     Family History  Adopted: Yes  Problem Relation Age of Onset   Cancer Mother    ADD / ADHD Mother    Alcohol abuse Mother    Anxiety disorder Mother    Social History   Socioeconomic History   Marital status: Widowed    Spouse name: Not on file   Number of children: Not on file   Years of education: Not on file   Highest education level: Not on file  Occupational  History   Not on file  Tobacco Use   Smoking status: Former    Current packs/day: 0.00    Average packs/day: 1 pack/day for 15.0 years (15.0 ttl pk-yrs)    Types: Cigarettes    Quit date: 10/12/1988    Years since quitting: 34.6   Smokeless tobacco: Never   Tobacco comments:    first woman hired for [employer] co. had to smoke  Vaping Use   Vaping status: Never Used  Substance and Sexual Activity   Alcohol use: Yes    Alcohol/week: 4.0 standard drinks of alcohol    Types: 4 Glasses of wine per week   Drug use: Never   Sexual activity: Not Currently  Other Topics Concern   Not on file  Social History Narrative   Not on file   Social Determinants of Health   Financial Resource Strain: Low Risk  (05/21/2023)   Overall Financial Resource Strain (CARDIA)    Difficulty  of Paying Living Expenses: Not hard at all  Food Insecurity: No Food Insecurity (05/21/2023)   Hunger Vital Sign    Worried About Running Out of Food in the Last Year: Never true    Ran Out of Food in the Last Year: Never true  Transportation Needs: No Transportation Needs (05/21/2023)   PRAPARE - Administrator, Civil Service (Medical): No    Lack of Transportation (Non-Medical): No  Physical Activity: Sufficiently Active (05/21/2023)   Exercise Vital Sign    Days of Exercise per Week: 5 days    Minutes of Exercise per Session: 40 min  Stress: No Stress Concern Present (05/21/2023)   [employer] of Occupational Health - Occupational Stress Questionnaire    Feeling of Stress : Not at all  Social Connections: Moderately Integrated (05/21/2023)   Social Connection and Isolation Panel [NHANES]    Frequency of Communication with Friends and Family: More than three times a week    Frequency of Social Gatherings with Friends and Family: More than three times a week    Attends Religious Services: More than 4 times per year    Active Member of Clubs or Organizations: No    Attends Banker Meetings: 1 to 4 times per year    Marital Status: Widowed    Tobacco Counseling Counseling given: Not Answered Tobacco comments: first woman hired for [employer] co. had to smoke   Clinical Intake:  Pre-visit preparation completed: Yes  Pain : No/denies pain   BMI - recorded: 22.48 Nutritional Status: BMI of 19-24  Normal Nutritional Risks: None Diabetes: No  How often do you need to have someone help you when you read instructions, pamphlets, or other written materials from your doctor or pharmacy?: 1 - Never  Interpreter Needed?: No  Comments: lives alone Information entered by :: B.Chiquitta Matty,LPN   Activities of Daily Living    05/21/2023   11:58 AM 04/27/2023   10:02 AM  In your present state of health, do you have any difficulty performing the following activities:   Hearing? 1 1  Vision? 0 0  Difficulty concentrating or making decisions? 0 0  Walking or climbing stairs? 0 0  Dressing or bathing? 0 0  Doing errands, shopping? 0 0  Preparing Food and eating ? N   Using the Toilet? N   In the past six months, have you accidently leaked urine? N   Do you have problems with loss of bowel control? N   Managing your Medications? N  Managing your Finances? N   Housekeeping or managing your Housekeeping? N     Patient Care Team: Margarita Mail, DO as PCP - General (Internal Medicine) Rosey Bath, MD (Inactive) as Referring Physician (Hematology and Oncology)  Indicate any recent Medical Services you may have received from other than [facility] providers in the past year (date may be approximate).     Assessment:   This is a routine wellness examination for Lajuanna.  Hearing/Vision screen Hearing Screening - Comments:: Inadequate hearing:needs hearing aids.Marland Kitchengot cheap ones that that did not work Is going to make appt w/ENT to retest Vision Screening - Comments:: Adequate vision Dr Clydene Pugh  Dietary issues and exercise activities discussed:     Goals Addressed   None    Depression Screen    05/21/2023   11:56 AM 04/27/2023   10:02 AM  PHQ 2/9 Scores  PHQ - 2 Score 0 0  PHQ- 9 Score  0    Fall Risk    05/21/2023   11:47 AM 04/27/2023   10:02 AM  Fall Risk   Falls in the past year? 0 0  Number falls in past yr: 0 0  Injury with Fall? 0 0  Risk for fall due to : No Fall Risks   Follow up Education provided;Falls prevention discussed     MEDICARE RISK AT HOME:  Medicare Risk at Home - 05/21/23 1147     Any stairs in or around the home? No    If so, are there any without handrails? No    Home free of loose throw rugs in walkways, pet beds, electrical cords, etc? Yes    Adequate lighting in your home to reduce risk of falls? Yes    Life alert? No    Use of a cane, walker or w/c? No    Grab bars in the bathroom? Yes     Shower chair or bench in shower? No   gets in jacuzzi   Elevated toilet seat or a handicapped toilet? No             TIMED UP AND GO:  Was the test performed? No    Cognitive Function:        05/21/2023   12:01 PM  6CIT Screen  What Year? 0 points  What month? 0 points  What time? 0 points  Count back from 20 0 points  Months in reverse 0 points  Repeat phrase 0 points  Total Score 0 points    Immunizations Immunization History  Administered Date(s) Administered   Influenza,inj,Quad PF,6+ Mos 08/23/2018, 07/20/2019, 06/16/2022   Moderna Sars-Covid-2 Vaccination 01/06/2020, 02/03/2020   Pneumococcal Conjugate-13 01/08/2017   Pneumococcal Polysaccharide-23 05/10/2018   Tdap 10/12/2013    TDAP status: Up to date  Flu Vaccine status: Up to date  Pneumococcal vaccine status: Completed during today's visit.  Covid-19 vaccine status: Completed vaccines  Qualifies for Shingles Vaccine? Yes   Zostavax completed No   Shingrix Completed?: No.    Education has been provided regarding the importance of this vaccine. Patient has been advised to call insurance company to determine out of pocket expense if they have not yet received this vaccine. Advised may also receive vaccine at local pharmacy or Health Dept. Verbalized acceptance and understanding.  Screening Tests Health Maintenance  Topic Date Due   DEXA SCAN  Never done   COVID-19 Vaccine (3 - 2023-24 season) 06/12/2022   INFLUENZA VACCINE  05/13/2023   Zoster Vaccines- Shingrix (1 of 2) 07/28/2023 (  Originally [birth year]1991)   DTaP/Tdap/Td (2 - Td or Tdap) 10/13/2023   Medicare Annual Wellness (AWV)  05/20/2024   Pneumonia Vaccine 35+ Years old  Completed   HPV VACCINES  Aged Out    Health Maintenance  Health Maintenance Due  Topic Date Due   DEXA SCAN  Never done   COVID-19 Vaccine (3 - 2023-24 season) 06/12/2022   INFLUENZA VACCINE  05/13/2023    Colorectal cancer screening: No longer required.    Mammogram status: No longer required due to age.  Bone Density status: Completed no. Results reflect: Bone density results: NORMAL. Repeat every 5 years.pt says had 30years NWG:[id number] need for it  Lung Cancer Screening: (Low Dose CT Chest recommended if Age 15-80 years, 20 pack-year currently smoking OR have quit w/in 15years.) does not qualify.   Lung Cancer Screening Referral: no  Additional Screening:  Hepatitis C Screening: does not qualify; Completed no  Vision Screening: Recommended annual ophthalmology exams for early detection of glaucoma and other disorders of the eye. Is the patient up to date with their annual eye exam?  Yes  Who is the provider or what is the name of the office in which the patient attends annual eye exams? Dr Clydene Pugh If pt is not established with a provider, would they like to be referred to a provider to establish care? No .   Dental Screening: Recommended annual dental exams for proper oral hygiene  Diabetic Foot Exam: n/a  Community Resource Referral / Chronic Care Management: CRR required this visit?  No   CCM required this visit?  No    Plan:     I have personally reviewed and noted the following in the patient's chart:   Medical and social history Use of alcohol, tobacco or illicit drugs  Current medications and supplements including opioid prescriptions. Patient is not currently taking opioid prescriptions. Functional ability and status Nutritional status Physical activity Advanced directives List of other physicians Hospitalizations, surgeries, and ER visits in previous 12 months Vitals Screenings to include cognitive, depression, and falls Referrals and appointments  In addition, I have reviewed and discussed with patient certain preventive protocols, quality metrics, and best practice recommendations. A written personalized care plan for preventive services as well as general preventive health recommendations were provided to  patient.    Sue Lush, LPN   03/16/7845   After Visit Summary: (MyChart) Due to this being a telephonic visit, the after visit summary with patients personalized plan was offered to patient via MyChart   Nurse Notes: The patient states she is experiencing an earache and some nasal/head congestion for a day and relays she will take Mucinex. I relayed to pt to call office for appt if fever arises or she worsens.Pt did not want to make appointment with provider, she prefers natural/herbal remedies.

## 2023-05-21 NOTE — Patient Instructions (Addendum)
[name] , Thank you for taking time to come for your Medicare Wellness Visit. I appreciate your ongoing commitment to your health goals. Please review the following plan we discussed and let me know if I can assist you in the future.   Referrals/Orders/Follow-Ups/Clinician Recommendations: none  This is a list of the screening recommended for you and due dates:  Health Maintenance  Topic Date Due   DEXA scan (bone density measurement)  Never done   COVID-19 Vaccine (3 - 2023-24 season) 06/12/2022   Flu Shot  05/13/2023   Zoster (Shingles) Vaccine (1 of 2) 07/28/2023*   DTaP/Tdap/Td vaccine (2 - Td or Tdap) 10/13/2023   Medicare Annual Wellness Visit  05/20/2024   Pneumonia Vaccine  Completed   HPV Vaccine  Aged Out  *Topic was postponed. The date shown is not the original due date.    Advanced directives: (In Chart) A copy of your advanced directives are scanned into your chart should your provider ever need it.  Next Medicare Annual Wellness Visit scheduled for next year: Yes 05/27/23 @ 11:45am telephone  Preventive Care 65 Years and Older, Female Preventive care refers to lifestyle choices and visits with your health care provider that can promote health and wellness. What does preventive care include? A yearly physical exam. This is also called an annual well check. Dental exams once or twice a year. Routine eye exams. Ask your health care provider how often you should have your eyes checked. Personal lifestyle choices, including: Daily care of your teeth and gums. Regular physical activity. Eating a healthy diet. Avoiding tobacco and drug use. Limiting alcohol use. Practicing safe sex. Taking low-dose aspirin every day. Taking vitamin and mineral supplements as recommended by your health care provider. What happens during an annual well check? The services and screenings done by your health care provider during your annual well check will depend on your age, overall  health, lifestyle risk factors, and family history of disease. Counseling  Your health care provider may ask you questions about your: Alcohol use. Tobacco use. Drug use. Emotional well-being. Home and relationship well-being. Sexual activity. Eating habits. History of falls. Memory and ability to understand (cognition). Work and work Astronomer. Reproductive health. Screening  You may have the following tests or measurements: Height, weight, and BMI. Blood pressure. Lipid and cholesterol levels. These may be checked every 5 years, or more frequently if you are over 34 years old. Skin check. Lung cancer screening. You may have this screening every year starting at age 26 if you have a 30-pack-year history of smoking and currently smoke or have quit within the past 15 years. Fecal occult blood test (FOBT) of the stool. You may have this test every year starting at age 22. Flexible sigmoidoscopy or colonoscopy. You may have a sigmoidoscopy every 5 years or a colonoscopy every 10 years starting at age 13. Hepatitis C blood test. Hepatitis B blood test. Sexually transmitted disease (STD) testing. Diabetes screening. This is done by checking your blood sugar (glucose) after you have not eaten for a while (fasting). You may have this done every 1-3 years. Bone density scan. This is done to screen for osteoporosis. You may have this done starting at age 13. Mammogram. This may be done every 1-2 years. Talk to your health care provider about how often you should have regular mammograms. Talk with your health care provider about your test results, treatment options, and if necessary, the need for more tests. Vaccines  Your health care  provider may recommend certain vaccines, such as: Influenza vaccine. This is recommended every year. Tetanus, diphtheria, and acellular pertussis (Tdap, Td) vaccine. You may need a Td booster every 10 years. Zoster vaccine. You may need this after age  62. Pneumococcal 13-valent conjugate (PCV13) vaccine. One dose is recommended after age 2. Pneumococcal polysaccharide (PPSV23) vaccine. One dose is recommended after age 52. Talk to your health care provider about which screenings and vaccines you need and how often you need them. This information is not intended to replace advice given to you by your health care provider. Make sure you discuss any questions you have with your health care provider. Document Released: 10/25/2015 Document Revised: 06/17/2016 Document Reviewed: 07/30/2015 Elsevier Interactive Patient Education  2017 [employer].  Fall Prevention in the Home Falls can cause injuries. They can happen to people of all ages. There are many things you can do to make your home safe and to help prevent falls. What can I do on the outside of my home? Regularly fix the edges of walkways and driveways and fix any cracks. Remove anything that might make you trip as you walk through a door, such as a raised step or threshold. Trim any bushes or trees on the path to your home. Use bright outdoor lighting. Clear any walking paths of anything that might make someone trip, such as rocks or tools. Regularly check to see if handrails are loose or broken. Make sure that both sides of any steps have handrails. Any raised decks and porches should have guardrails on the edges. Have any leaves, snow, or ice cleared regularly. Use sand or salt on walking paths during winter. Clean up any spills in your garage right away. This includes oil or grease spills. What can I do in the bathroom? Use night lights. Install grab bars by the toilet and in the tub and shower. Do not use towel bars as grab bars. Use non-skid mats or decals in the tub or shower. If you need to sit down in the shower, use a plastic, non-slip stool. Keep the floor dry. Clean up any water that spills on the floor as soon as it happens. Remove soap buildup in the tub or shower  regularly. Attach bath mats securely with double-sided non-slip rug tape. Do not have throw rugs and other things on the floor that can make you trip. What can I do in the bedroom? Use night lights. Make sure that you have a light by your bed that is easy to reach. Do not use any sheets or blankets that are too big for your bed. They should not hang down onto the floor. Have a firm chair that has side arms. You can use this for support while you get dressed. Do not have throw rugs and other things on the floor that can make you trip. What can I do in the kitchen? Clean up any spills right away. Avoid walking on wet floors. Keep items that you use a lot in easy-to-reach places. If you need to reach something above you, use a strong step stool that has a grab bar. Keep electrical cords out of the way. Do not use floor polish or wax that makes floors slippery. If you must use wax, use non-skid floor wax. Do not have throw rugs and other things on the floor that can make you trip. What can I do with my stairs? Do not leave any items on the stairs. Make sure that there are handrails on both  sides of the stairs and use them. Fix handrails that are broken or loose. Make sure that handrails are as long as the stairways. Check any carpeting to make sure that it is firmly attached to the stairs. Fix any carpet that is loose or worn. Avoid having throw rugs at the top or bottom of the stairs. If you do have throw rugs, attach them to the floor with carpet tape. Make sure that you have a light switch at the top of the stairs and the bottom of the stairs. If you do not have them, ask someone to add them for you. What else can I do to help prevent falls? Wear shoes that: Do not have high heels. Have rubber bottoms. Are comfortable and fit you well. Are closed at the toe. Do not wear sandals. If you use a stepladder: Make sure that it is fully opened. Do not climb a closed stepladder. Make sure that  both sides of the stepladder are locked into place. Ask someone to hold it for you, if possible. Clearly mark and make sure that you can see: Any grab bars or handrails. First and last steps. Where the edge of each step is. Use tools that help you move around (mobility aids) if they are needed. These include: Canes. Walkers. Scooters. Crutches. Turn on the lights when you go into a dark area. Replace any light bulbs as soon as they burn out. Set up your furniture so you have a clear path. Avoid moving your furniture around. If any of your floors are uneven, fix them. If there are any pets around you, be aware of where they are. Review your medicines with your doctor. Some medicines can make you feel dizzy. This can increase your chance of falling. Ask your doctor what other things that you can do to help prevent falls. This information is not intended to replace advice given to you by your health care provider. Make sure you discuss any questions you have with your health care provider. Document Released: 07/25/2009 Document Revised: 03/05/2016 Document Reviewed: 11/02/2014 Elsevier Interactive Patient Education  2017 [employer].

## 2023-06-15 ENCOUNTER — Ambulatory Visit: Payer: Medicare HMO

## 2023-06-22 ENCOUNTER — Ambulatory Visit: Payer: Medicare HMO

## 2023-07-09 ENCOUNTER — Telehealth: Payer: Self-pay | Admitting: Internal Medicine

## 2023-07-09 NOTE — Telephone Encounter (Signed)
Copied from CRM [id number]. Topic: Appointment Scheduling - Scheduling Inquiry for Clinic >> Jul 09, 2023  9:44 AM Stacy Brewer wrote: Reason for CRM: The patient called in to request an order for blood work. She states she had a terrible infection of her tooth under a bridge. She was on 2 antibiotics and 2 zpacks. She says her eyes are yellow and she has had pretty bad pain in her kidneys and back. She also says she has vomited as well. She doesn't have much of an appetite. She wants to check this and go over it with her provider. Please assist patient further either before 2 or after 4.

## 2023-07-12 NOTE — Telephone Encounter (Signed)
Called and lvm informing patient

## 2023-07-12 NOTE — Telephone Encounter (Signed)
Pt calling back for the order of her blood work she asked for in previous message.  Pt states she gave enough info to the agent for the dr to order labs.  Advised pt she may need to be seen for the dr to order labs.  Pt is very adamant and somewhat pushy, as she wants what she wants. Pt reminded me she has yellow eyes, diarrhea, and the fact she has been on abx.   I asked pt to please speak w/ my triage nurse.  She said NO!  She does not want to speak to anyone but Steward Drone.   She said Steward Drone know me and has ordered my labs before

## 2023-07-13 ENCOUNTER — Telehealth: Payer: Self-pay | Admitting: Internal Medicine

## 2023-07-13 NOTE — Telephone Encounter (Signed)
Pt called in mentioned,  she had a fall  (in the past), about 2 months ago, and said she had vomiting and dizziness a week 1/2 ago, but say she is ok now. Se declined speaking with a nurse. I also let her know about her appt and she has to see the Dr first before ordering labs. Pt didn't like this answer, but understood. She also mentioned sending a message to [place] even after understanding the info given.

## 2023-07-21 NOTE — Progress Notes (Unsigned)
   Acute Office Visit  Subjective:     Patient ID: Stacy Brewer, female    DOB: 03/09/1941, 82 y.o.   MRN: [id number]  No chief complaint on file.   HPI Patient is in today for jaundice, nausea ?  ROS      Objective:    There were no vitals taken for this visit. {Vitals History (Optional):23777}  Physical Exam  No results found for any visits on 07/22/23.      Assessment & Plan:   Problem List Items Addressed This Visit   None   No orders of the defined types were placed in this encounter.   No follow-ups on file.  Margarita Mail, DO

## 2023-07-22 ENCOUNTER — Encounter: Payer: Self-pay | Admitting: Internal Medicine

## 2023-07-22 ENCOUNTER — Ambulatory Visit: Payer: Medicare HMO | Admitting: Internal Medicine

## 2023-07-22 VITALS — BP 126/74 | HR 67 | Temp 97.7°F | Resp 16 | Ht 64.5 in | Wt 131.8 lb

## 2023-07-22 DIAGNOSIS — R350 Frequency of micturition: Secondary | ICD-10-CM | POA: Diagnosis not present

## 2023-07-22 DIAGNOSIS — R42 Dizziness and giddiness: Secondary | ICD-10-CM

## 2023-07-22 DIAGNOSIS — R112 Nausea with vomiting, unspecified: Secondary | ICD-10-CM | POA: Diagnosis not present

## 2023-07-22 DIAGNOSIS — R197 Diarrhea, unspecified: Secondary | ICD-10-CM

## 2023-07-22 DIAGNOSIS — R1013 Epigastric pain: Secondary | ICD-10-CM | POA: Diagnosis not present

## 2023-07-22 DIAGNOSIS — M62838 Other muscle spasm: Secondary | ICD-10-CM

## 2023-07-22 DIAGNOSIS — Z23 Encounter for immunization: Secondary | ICD-10-CM | POA: Diagnosis not present

## 2023-07-22 DIAGNOSIS — H6993 Unspecified Eustachian tube disorder, bilateral: Secondary | ICD-10-CM

## 2023-07-22 LAB — POCT URINALYSIS DIPSTICK
Bilirubin, UA: NEGATIVE
Blood, UA: NEGATIVE
Glucose, UA: NEGATIVE
Ketones, UA: NEGATIVE
Leukocytes, UA: NEGATIVE
Nitrite, UA: NEGATIVE
Odor: NORMAL
Protein, UA: NEGATIVE
Spec Grav, UA: 1.02 (ref 1.010–1.025)
Urobilinogen, UA: 0.2 U/dL
pH, UA: 7 (ref 5.0–8.0)

## 2023-07-22 MED ORDER — ONDANSETRON 4 MG PO TBDP
4.0000 mg | ORAL_TABLET | Freq: Three times a day (TID) | ORAL | 0 refills | Status: DC | PRN
Start: 2023-07-22 — End: 2024-02-17

## 2023-07-22 MED ORDER — TIZANIDINE HCL 4 MG PO TABS
4.0000 mg | ORAL_TABLET | Freq: Every evening | ORAL | 0 refills | Status: DC | PRN
Start: 2023-07-22 — End: 2023-08-13

## 2023-07-22 MED ORDER — FLUTICASONE PROPIONATE 50 MCG/ACT NA SUSP
2.0000 | Freq: Every day | NASAL | 1 refills | Status: DC
Start: 2023-07-22 — End: 2023-08-13

## 2023-07-22 NOTE — Patient Instructions (Addendum)
It was great seeing you today!  Plan discussed at today's visit: -Blood work ordered today, results will be uploaded to MyChart.  -Stool studies and urine test ordered today as well -Recommend Zofran as needed for nausea, nasal saline and Flonase (nasal spray) 2 sprays on each side twice a day for ear pain/dizziness.  -Muscle relaxer to take at night sent as well  Follow up in: 1 month   Take care and let us know if you have any questions or concerns prior to your next visit.  Dr. Caralee Ates

## 2023-07-23 LAB — FOOD ALLERGY PROFILE
Allergen, Salmon, f41: <0.1 kU/L
Almonds: <0.1 kU/L
CLASS: 0
CLASS: 0
CLASS: 0
CLASS: 0
CLASS: 0
CLASS: 0
CLASS: 0
CLASS: 0
CLASS: 0
CLASS: 0
CLASS: 0
Cashew IgE: <0.1 kU/L
Class: 0
Class: 0
Class: 0
Class: 0
Egg White IgE: <0.1 kU/L
Fish Cod: <0.1 kU/L
Hazelnut: <0.1 kU/L
Milk IgE: <0.1 kU/L
Peanut IgE: <0.1 kU/L
Scallop IgE: <0.1 kU/L
Sesame Seed f10: <0.1 kU/L
Shrimp IgE: <0.1 kU/L
Soybean IgE: <0.1 kU/L
Tuna IgE: <0.1 kU/L
Walnut: <0.1 kU/L
Wheat IgE: <0.1 kU/L

## 2023-07-23 LAB — CBC WITH DIFFERENTIAL/PLATELET
Absolute Monocytes: 553 {cells}/uL (ref 200–950)
Basophils Absolute: 42 {cells}/uL (ref 0–200)
Basophils Relative: 0.6 %
Eosinophils Absolute: 98 {cells}/uL (ref 15–500)
Eosinophils Relative: 1.4 %
HCT: 44.4 % (ref 35.0–45.0)
Hemoglobin: 14.6 g/dL (ref 11.7–15.5)
Lymphs Abs: 2177 {cells}/uL (ref 850–3900)
MCH: 30.1 pg (ref 27.0–33.0)
MCHC: 32.9 g/dL (ref 32.0–36.0)
MCV: 91.5 fL (ref 80.0–100.0)
MPV: 11.3 fL (ref 7.5–12.5)
Monocytes Relative: 7.9 %
Neutro Abs: 4130 {cells}/uL (ref 1500–7800)
Neutrophils Relative %: 59 %
Platelets: 350 10*3/uL (ref 140–400)
RBC: 4.85 10*6/uL (ref 3.80–5.10)
RDW: 11.4 % (ref 11.0–15.0)
Total Lymphocyte: 31.1 %
WBC: 7 10*3/uL (ref 3.8–10.8)

## 2023-07-23 LAB — COMPLETE METABOLIC PANEL WITH GFR
AG Ratio: 2 (calc) (ref 1.0–2.5)
ALT: 12 U/L (ref 6–29)
AST: 15 U/L (ref 10–35)
Albumin: 4.6 g/dL (ref 3.6–5.1)
Alkaline phosphatase (APISO): 72 U/L (ref 37–153)
BUN: 12 mg/dL (ref 7–25)
CO2: 29 mmol/L (ref 20–32)
Calcium: 10.4 mg/dL (ref 8.6–10.4)
Chloride: 100 mmol/L (ref 98–110)
Creat: 0.74 mg/dL (ref 0.60–0.95)
Globulin: 2.3 g/dL (ref 1.9–3.7)
Glucose, Bld: 91 mg/dL (ref 65–99)
Potassium: 4.3 mmol/L (ref 3.5–5.3)
Sodium: 138 mmol/L (ref 135–146)
Total Bilirubin: 0.5 mg/dL (ref 0.2–1.2)
Total Protein: 6.9 g/dL (ref 6.1–8.1)
eGFR: 81 mL/min/{1.73_m2} (ref >=60)

## 2023-07-23 LAB — LIPASE: Lipase: <5 U/L — ABNORMAL LOW (ref 7–60)

## 2023-07-23 LAB — INTERPRETATION:

## 2023-07-29 LAB — OVA AND PARASITE EXAMINATION
CONCENTRATE RESULT:: NONE SEEN
MICRO NUMBER:: 15591542
SPECIMEN QUALITY:: ADEQUATE
TRICHROME RESULT:: NONE SEEN

## 2023-07-29 LAB — SALMONELLA/SHIGELLA CULT, CAMPY EIA AND SHIGA TOXIN RFL ECOLI
MICRO NUMBER: 15591593
MICRO NUMBER:: 15591594
MICRO NUMBER:: 15591595
Result:: NOT DETECTED
SHIGA RESULT:: NOT DETECTED
SPECIMEN QUALITY: ADEQUATE
SPECIMEN QUALITY:: ADEQUATE
SPECIMEN QUALITY:: ADEQUATE

## 2023-07-29 LAB — C. DIFFICILE GDH AND TOXIN A/B
GDH ANTIGEN: NOT DETECTED
MICRO NUMBER:: 15592526
SPECIMEN QUALITY:: ADEQUATE
TOXIN A AND B: NOT DETECTED

## 2023-07-30 ENCOUNTER — Other Ambulatory Visit: Payer: Self-pay | Admitting: Internal Medicine

## 2023-08-02 ENCOUNTER — Other Ambulatory Visit: Payer: Self-pay | Admitting: Internal Medicine

## 2023-08-13 ENCOUNTER — Other Ambulatory Visit: Payer: Self-pay | Admitting: Internal Medicine

## 2023-08-13 DIAGNOSIS — H6993 Unspecified Eustachian tube disorder, bilateral: Secondary | ICD-10-CM

## 2023-08-13 DIAGNOSIS — M62838 Other muscle spasm: Secondary | ICD-10-CM

## 2023-08-13 NOTE — Telephone Encounter (Signed)
Requested medication (s) are due for refill today: yes  Requested medication (s) are on the active medication list: yes  Last refill:  07/22/23  Future visit scheduled: yes  Notes to clinic:  Unable to refill per protocol, cannot delegate.      Requested Prescriptions  Pending Prescriptions Disp Refills   tiZANidine (ZANAFLEX) 4 MG tablet [Pharmacy Med Name: TIZANIDINE HCL 4 MG TABLET] 90 tablet 1    Sig: TAKE 1 TABLET (4 MG TOTAL) BY MOUTH EVERY DAY AT BEDTIME AS NEEDED     Not Delegated - Cardiovascular:  Alpha-2 Agonists - tizanidine Failed - 08/13/2023  9:33 AM      Failed - This refill cannot be delegated      Passed - Valid encounter within last 6 months    Recent Outpatient Visits           3 weeks ago Diarrhea, unspecified type   [facility] Margarita Mail, DO   3 months ago Scoliosis, unspecified scoliosis type, unspecified spinal region   [facility] Margarita Mail, DO       Future Appointments             In 6 days Margarita Mail, DO [facility]             fluticasone [facility]) 50 MCG/ACT nasal spray [Pharmacy Med Name: FLUTICASONE PROP 50 MCG SPRAY] 48 mL 1    Sig: SPRAY 2 SPRAYS INTO EACH NOSTRIL EVERY DAY     Ear, Nose, and Throat: Nasal Preparations - Corticosteroids Passed - 08/13/2023  9:33 AM      Passed - Valid encounter within last 12 months    Recent Outpatient Visits           3 weeks ago Diarrhea, unspecified type   [facility] Margarita Mail, DO   3 months ago Scoliosis, unspecified scoliosis type, unspecified spinal region   [facility] Margarita Mail, DO       Future Appointments             In 6 days Margarita Mail, DO [facility], [facility]

## 2023-08-18 NOTE — Progress Notes (Unsigned)
Acute Office Visit  Subjective:     Patient ID: Stacy Brewer, female    DOB: 1941-06-03, 82 y.o.   MRN: [id number]  No chief complaint on file.   HPI Patient is in today for diarrhea, itchiness, dizziness, polyuria, eye discoloration.  Patient states that she has diverticulosis and will occasionally have changes in stool caliber and abdominal pain, however this has been increased for the last several weeks.  She states she had surgery to remove a mass on the left side of her mouth and prior to this was on azithromycin for 2 weeks followed by another 3 weeks of penicillin.  Since this time she has been having severe regular diarrhea.  Overall time it has been slowly improving, she did have a normal bowel movement today but it will occasionally flareup with urgent diarrhea.  She is also having epigastric abdominal pain associated with nausea and vomiting.  The last time this happened was this past Sunday while she was at church.  She denies urinary symptoms other than increased urinary urgency.  She denies fevers.  She is also noticed dizziness with the room spinning.  She denies sinus pain or pressure but does have some ear fullness.  Denies other upper respiratory symptoms.  She does also note a yellow discoloration in her eyes in the morning but this resolves throughout the day with eyedrops.   Review of Systems  Constitutional:  Negative for chills and fever.  HENT:  Negative for congestion, ear pain, sinus pain and sore throat.   Eyes:  Negative for blurred vision.  Respiratory:  Negative for cough.   Gastrointestinal:  Positive for abdominal pain, diarrhea, nausea and vomiting.  Genitourinary:  Positive for frequency and urgency. Negative for dysuria and hematuria.  Neurological:  Positive for dizziness.        Objective:    There were no vitals taken for this visit. BP Readings from Last 3 Encounters:  07/22/23 126/74  04/27/23 (!) 142/86  02/13/23 (!) 169/96   Wt  Readings from Last 3 Encounters:  07/22/23 131 lb 12.8 oz (59.8 kg)  05/21/23 133 lb (60.3 kg)  04/27/23 133 lb 6.4 oz (60.5 kg)      Physical Exam Constitutional:      Appearance: Normal appearance.  HENT:     Head: Normocephalic and atraumatic.     Right Ear: Ear canal and external ear normal.     Left Ear: Ear canal and external ear normal.     Ears:     Comments: Bilateral eustachian tube dysfunction present    Nose: Nose normal.     Mouth/Throat:     Mouth: Mucous membranes are moist.     Pharynx: Oropharynx is clear.  Eyes:     Extraocular Movements: Extraocular movements intact.     Conjunctiva/sclera: Conjunctivae normal.     Pupils: Pupils are equal, round, and reactive to light.  Neck:     Comments: No thyromegaly Cardiovascular:     Rate and Rhythm: Normal rate and regular rhythm.  Pulmonary:     Effort: Pulmonary effort is normal.     Breath sounds: Normal breath sounds.  Abdominal:     General: Bowel sounds are normal. There is no distension.     Palpations: Abdomen is soft. There is no mass.     Tenderness: There is no abdominal tenderness. There is no guarding or rebound.     Hernia: No hernia is present.  Musculoskeletal:     Cervical  back: No tenderness.  Lymphadenopathy:     Cervical: No cervical adenopathy.  Skin:    General: Skin is warm and dry.  Neurological:     General: No focal deficit present.     Mental Status: She is alert. Mental status is at baseline.  Psychiatric:        Mood and Affect: Mood normal.        Behavior: Behavior normal.     No results found for any visits on 08/19/23.      Assessment & Plan:   1. Diarrhea, unspecified type/Epigastric pain/Nausea and vomiting, unspecified vomiting type/Increased urinary frequency: Abdominal exam today benign, did have a normal bowel movement today.  Will obtain stool studies, O&P and C. difficile PCR with history of 5 weeks of antibiotics recently.  Will check CBC, CMP and lipase as  well as a food allergy panel.  For now recommend bland diet and starting an elimination diet, especially with dairy.  Patient does have a history of cholecystectomy, discussed decreasing fats in the diet.  UA in the office negative.  Will prescribe Zofran to take as needed for nausea.  Follow-up in 1 month.  - COMPLETE METABOLIC PANEL WITH GFR - CBC w/Diff/Platelet - Lipase - Food Allergy Profile - Ova and parasite examination - C. difficile GDH and Toxin A/B - Salmonella/Shigella Cult, Campy EIA and Shiga Toxin reflex - ondansetron (ZOFRAN-ODT) 4 MG disintegrating tablet; Take 1 tablet (4 mg total) by mouth every 8 (eight) hours as needed for nausea or vomiting.  Dispense: 20 tablet; Refill: 0 - POCT Urinalysis Dipstick  2. Dizziness/Eustachian tube dysfunction, bilateral: Bilateral eustachian tube dysfunction present, recommend nasal saline and nasal steroids.  - fluticasone (FLONASE) 50 MCG/ACT nasal spray; Place 2 sprays into both nostrils daily.  Dispense: 16 g; Refill: 1  3. Night muscle spasms: Muscle relaxer prescribed.  - tiZANidine (ZANAFLEX) 4 MG tablet; Take 1 tablet (4 mg total) by mouth at bedtime as needed.  Dispense: 30 tablet; Refill: 0  4. Need for influenza vaccination: Flu vaccine administered today.   - Flu Vaccine Trivalent High Dose (Fluad)   No follow-ups on file.  Margarita Mail, DO

## 2023-08-19 ENCOUNTER — Ambulatory Visit (INDEPENDENT_AMBULATORY_CARE_PROVIDER_SITE_OTHER): Payer: Medicare HMO | Admitting: Internal Medicine

## 2023-08-19 ENCOUNTER — Encounter: Payer: Self-pay | Admitting: Internal Medicine

## 2023-08-19 VITALS — BP 122/82 | HR 75 | Temp 97.2°F | Resp 16 | Ht 64.5 in | Wt 139.1 lb

## 2023-08-19 DIAGNOSIS — K591 Functional diarrhea: Secondary | ICD-10-CM | POA: Diagnosis not present

## 2023-08-19 DIAGNOSIS — R112 Nausea with vomiting, unspecified: Secondary | ICD-10-CM

## 2023-08-19 NOTE — Patient Instructions (Addendum)
It was great seeing you today!  Plan discussed at today's visit: -Try diet recommendations below, continue probiotics   Follow up in: 6 months  Take care and let us know if you have any questions or concerns prior to your next visit.  Dr. Caralee Ates  Low-FODMAP Eating Plan  FODMAP stands for fermentable oligosaccharides, disaccharides, monosaccharides, and polyols. These are sugars that are hard for some people to digest. A low-FODMAP eating plan may help some people who have irritable bowel syndrome (IBS) and certain other bowel (intestinal) diseases to manage their symptoms. This meal plan can be complicated to follow. Work with a diet and nutrition specialist (dietitian) to make a low-FODMAP eating plan that is right for you. A dietitian can help make sure that you get enough nutrition from this diet. What are tips for following this plan? Reading food labels Check labels for hidden FODMAPs such as: High-fructose syrup. Honey. Agave. Natural fruit flavors. Onion or garlic powder. Choose low-FODMAP foods that contain 3-4 grams of fiber per serving. Check food labels for serving sizes. Eat only one serving at a time to make sure FODMAP levels stay low. Shopping Shop with a list of foods that are recommended on this diet and make a meal plan. Meal planning Follow a low-FODMAP eating plan for up to 6 weeks, or as told by your health care provider or dietitian. To follow the eating plan: Eliminate high-FODMAP foods from your diet completely. Choose only low-FODMAP foods to eat. You will do this for 2-6 weeks. Gradually reintroduce high-FODMAP foods into your diet one at a time. Most people should wait a few days before introducing the next new high-FODMAP food into their meal plan. Your dietitian can recommend how quickly you may reintroduce foods. Keep a daily record of what and how much you eat and drink. Make note of any symptoms that you have after eating. Review your daily record  with a dietitian regularly to identify which foods you can eat and which foods you should avoid. General tips Drink enough fluid each day to keep your urine pale yellow. Avoid processed foods. These often have added sugar and may be high in FODMAPs. Avoid most dairy products, whole grains, and sweeteners. Work with a dietitian to make sure you get enough fiber in your diet. Avoid high FODMAP foods at meals to manage symptoms. Recommended foods Fruits Bananas, oranges, tangerines, lemons, limes, blueberries, raspberries, strawberries, grapes, cantaloupe, honeydew melon, kiwi, papaya, passion fruit, and pineapple. Limited amounts of dried cranberries, banana chips, and shredded coconut. Vegetables Eggplant, zucchini, cucumber, peppers, green beans, bean sprouts, lettuce, arugula, kale, Swiss chard, spinach, collard greens, bok choy, summer squash, potato, and tomato. Limited amounts of corn, carrot, and sweet potato. Green parts of scallions. Grains Gluten-free grains, such as rice, oats, buckwheat, quinoa, corn, polenta, and millet. Gluten-free pasta, bread, or cereal. Rice noodles. Corn tortillas. Meats and other proteins Unseasoned beef, pork, poultry, or fish. Eggs. Tomasa Blase. Tofu (firm) and tempeh. Limited amounts of nuts and seeds, such as almonds, walnuts, [place] nuts, pecans, peanuts, nut butters, pumpkin seeds, chia seeds, and sunflower seeds. Dairy Lactose-free milk, yogurt, and kefir. Lactose-free cottage cheese and ice cream. Non-dairy milks, such as almond, coconut, hemp, and rice milk. Non-dairy yogurt. Limited amounts of goat cheese, brie, mozzarella, parmesan, swiss, and other hard cheeses. Fats and oils Butter-free spreads. Vegetable oils, such as olive, canola, and sunflower oil. Seasoning and other foods Artificial sweeteners with names that do not end in "ol," such as aspartame, saccharine,  and stevia. Maple syrup, white table sugar, raw sugar, brown sugar, and molasses.  Mayonnaise, soy sauce, and tamari. Fresh basil, coriander, parsley, rosemary, and thyme. Beverages Water and mineral water. Sugar-sweetened soft drinks. Small amounts of orange juice or cranberry juice. Black and green tea. Most dry wines. Coffee. The items listed above may not be a complete list of foods and beverages you can eat. Contact a dietitian for more information. Foods to avoid Fruits Fresh, dried, and juiced forms of apple, pear, watermelon, peach, plum, cherries, apricots, blackberries, boysenberries, figs, nectarines, and mango. Avocado. Vegetables Chicory root, artichoke, asparagus, cabbage, snow peas, Brussels sprouts, broccoli, sugar snap peas, mushrooms, celery, and cauliflower. Onions, garlic, leeks, and the white part of scallions. Grains Wheat, including kamut, durum, and semolina. Barley and bulgur. Couscous. Wheat-based cereals. Wheat noodles, bread, crackers, and pastries. Meats and other proteins Fried or fatty meat. Sausage. Cashews and pistachios. Soybeans, baked beans, black beans, chickpeas, kidney beans, fava beans, navy beans, lentils, black-eyed peas, and split peas. Dairy Milk, yogurt, ice cream, and soft cheese. Cream and sour cream. Milk-based sauces. Custard. Buttermilk. Soy milk. Seasoning and other foods Any sugar-free gum or candy. Foods that contain artificial sweeteners such as sorbitol, mannitol, isomalt, or xylitol. Foods that contain honey, high-fructose corn syrup, or agave. Bouillon, vegetable stock, beef stock, and chicken stock. Garlic and onion powder. Condiments made with onion, such as hummus, chutney, pickles, relish, salad dressing, and salsa. Tomato paste. Beverages Chicory-based drinks. Coffee substitutes. Chamomile tea. Fennel tea. Sweet or fortified wines such as port or sherry. Diet soft drinks made with isomalt, mannitol, maltitol, sorbitol, or xylitol. Apple, pear, and mango juice. Juices with high-fructose corn syrup. The items listed  above may not be a complete list of foods and beverages you should avoid. Contact a dietitian for more information. Summary FODMAP stands for fermentable oligosaccharides, disaccharides, monosaccharides, and polyols. These are sugars that are hard for some people to digest. A low-FODMAP eating plan is a short-term diet that helps to ease symptoms of certain bowel diseases. The eating plan usually lasts up to 6 weeks. After that, high-FODMAP foods are reintroduced gradually and one at a time. This can help you find out which foods may be causing symptoms. A low-FODMAP eating plan can be complicated. It is best to work with a dietitian who has experience with this type of plan. This information is not intended to replace advice given to you by your health care provider. Make sure you discuss any questions you have with your health care provider. Document Revised: 02/15/2020 Document Reviewed: 02/15/2020 Elsevier Patient Education  2024 [employer].

## 2023-10-14 ENCOUNTER — Ambulatory Visit: Payer: Self-pay

## 2023-10-14 NOTE — Telephone Encounter (Signed)
 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 Patient reports mouth and jaw pain for about 2 weeks now. She is not able to chew properly because of the pain. Patient stated when she called the dentist who did the procedure the receptionist told her that the provider will be out of office until next Monday. She stated the provider gave her a muscle relaxer when she had the same problem. Patient called her regular dentist who stated he can not prescribe a muscle relaxer. Has a dental appointment scheduled in a week and half. Patient is requesting a muscle relaxer be sent to her pharmacy until she can get in to be seen by the dentist. No appointments available before patient's dental appointment. Please advise. Summary: Pt requests Rx for a muscle relaxer   Pt stated that it is really painful for her to chew and she requests that a Rx for a muscle relaxer be sent to her pharmacy.     Reason for Disposition  [1] MILD-MODERATE mouth pain AND [2] present > 3 days  Answer Assessment - Initial Assessment Questions 1. ONSET: When did the mouth start hurting? (e.g., hours or days ago)      2 weeks  2. SEVERITY: How bad is the pain? (Scale 1-10; mild, moderate or severe)   - MILD (1-3):  doesn't interfere with eating or normal activities   - MODERATE (4-7): interferes with eating some solids and normal activities   - SEVERE (8-10):  excruciating pain, interferes with most normal activities   - SEVERE  DYSPHAGIA: can't swallow liquids, drooling     10/10 3. SORES: Are there any sores or ulcers in the mouth? If Yes, ask: What part of the mouth are the sores in?     No  4. FEVER: Do you have a fever? If Yes, ask: What is your temperature, how was it measured, and when did it start?     No  5. CAUSE: What do you think is causing the mouth pain?     TMJ 6. OTHER SYMPTOMS: Do you have any other symptoms? (e.g., difficulty breathing)    Difficult chewing, jaw is out of place  Protocols used: Mouth Pain-A-AH

## 2023-10-15 NOTE — Telephone Encounter (Signed)
 Called to inform pt that she would need an appt to get any medications. Pt was advised of no openings today and to call [facility] or [facility] or to go to [facility]. Pt states she will go to [facility]

## 2024-01-31 ENCOUNTER — Ambulatory Visit: Payer: Self-pay

## 2024-01-31 NOTE — Telephone Encounter (Signed)
 Will need app

## 2024-01-31 NOTE — Telephone Encounter (Signed)
 Spoke with pt to schedule her an appointment and she declined. She stated that she has an appt in 2wks and will just be seen then.

## 2024-01-31 NOTE — Telephone Encounter (Signed)
 Chief Complaint: Cat bite Symptoms: redness around bite Frequency: occurred on 04/17 Pertinent Negatives: Patient denies fever Disposition: [] ED /[] Urgent Care (no appt availability in office) / [x] Appointment(In office/virtual)/ []  [facility]/ [] Home Care/ [x] Refused Recommended Disposition /[]  Mobile Bus/ [x]  Follow-up with PCP Additional Notes: Patient called in stating she was clipping her cat's toenails on the evening of 04/17 and her cat bit her. Patient states "I started having diarrhea, sweating, nausea, and fatigue but I am over the hump and I am better, but I think the poison is still in me and I need Dr. Nicolette Barrio to call me in antibiotics". This RN suggested an appt for evaluation of the cat bite. Patient's last tetanus on file is 2015. Patient states she really does not want to come into the office and is asking that a message be sent to PCP to ask if she can call in antibiotics to [facility] on [address]. Patient also states she will ask for a tetanus booster at her upcoming appt in May.   Please advise if abx can be called in at this time for the cat bite. This RN informed patient that we may need to reach out to her if Dr. Nicolette Barrio thinks it's necessary for her to come into office first. Patient verbalized understanding.    Copied from CRM [id number]. Topic: Appointments - Appointment Scheduling >> Jan 31, 2024  9:44 AM Rosaria Common wrote: Patient/patient representative is calling to schedule an appointment. Refer to attachments for appointment information. Cat bite on 4/17 and experiencing nausea, diarrhea, and other symptoms. Reason for Disposition  [1] Puncture wound (hole through the skin) AND [2] from a cat bite (or deep claw puncture wound)  Answer Assessment - Initial Assessment Questions 1. ANIMAL: "What type of animal caused the bite?" "Is the injury from a bite or a claw?" If the animal is a dog or a cat, ask: "Was it a pet or a stray?" "Was it acting ill or  behaving strangely?"     Cat bite (pet cat) 2. LOCATION: "Where is the bite located?"      Right Wrist  3. SIZE: "How big is the bite?" "What does it look like?"      Bite with surrounding area that is red 4. ONSET: "When did the bite happen?" (Minutes or hours ago)      01/27/24 at night 5. CIRCUMSTANCES: "Tell me how this happened."      Cat bite while clipping nails 6. TETANUS: "When was your last tetanus booster?"     2015 7. RABIES VACCINE: For dog or cat bites, ask: "Do you know if the pet is vaccinated against rabies?"  (e.g., yes, no, overdue for rabies shot, unknown)     Yes  Protocols used: Animal Bite-A-AH

## 2024-02-17 ENCOUNTER — Encounter: Payer: Self-pay | Admitting: Internal Medicine

## 2024-02-17 ENCOUNTER — Other Ambulatory Visit: Payer: Self-pay

## 2024-02-17 ENCOUNTER — Ambulatory Visit: Attending: Internal Medicine

## 2024-02-17 ENCOUNTER — Ambulatory Visit (INDEPENDENT_AMBULATORY_CARE_PROVIDER_SITE_OTHER): Payer: Self-pay | Admitting: Internal Medicine

## 2024-02-17 VITALS — BP 134/78 | HR 76 | Temp 97.9°F | Resp 16 | Ht 64.5 in | Wt 131.2 lb

## 2024-02-17 DIAGNOSIS — R002 Palpitations: Secondary | ICD-10-CM

## 2024-02-17 DIAGNOSIS — K591 Functional diarrhea: Secondary | ICD-10-CM

## 2024-02-17 DIAGNOSIS — I25110 Atherosclerotic heart disease of native coronary artery with unstable angina pectoris: Secondary | ICD-10-CM

## 2024-02-17 DIAGNOSIS — R079 Chest pain, unspecified: Secondary | ICD-10-CM | POA: Diagnosis not present

## 2024-02-17 DIAGNOSIS — I480 Paroxysmal atrial fibrillation: Secondary | ICD-10-CM | POA: Diagnosis not present

## 2024-02-17 NOTE — Progress Notes (Signed)
 Acute Office Visit  Subjective:     Patient ID: Stacy Brewer, female    DOB: 1941-03-17, 83 y.o.   MRN: [id number]  Chief Complaint  Patient presents with   Medical Management of Chronic Issues    6 month recheck    HPI Patient is in today for chest pain and shortness of breath.   Discussed the use of AI scribe software for clinical note transcription with the patient, who gave verbal consent to proceed.  History of Present Illness She experiences dull chest pain under her left breast, primarily when lying flat. The pain is not sharp, and she is uncertain of its origin. She also has shortness of breath and occasional sharp pain that catches her breath when standing, though these episodes are infrequent and not related to exertion.  Her coronary artery disease was diagnosed ten years ago, described as mild in the main artery to her heart. She had a silent myocardial infarction and underwent heart surgery for atrial fibrillation, which has not recurred. An EKG last year showed PVCs.  She has lung nodules discovered incidentally during a CT scan for her stomach. A follow-up scan showed more nodules, but they were not of significant size. A chest x-ray in May 2024 did not note any changes.  She has been experiencing diarrhea for five weeks, with only one normal stool during this period. She attributes this to not having a gallbladder and is concerned about her pancreas and liver, noting orange bile in her stools. She follows a diet provided by a dietician due to her stomach issues and has a history of diverticulitis.  She reports a lack of appetite, eating only one meal a day, and experiencing night sweats. She feels fatigued, with low energy levels, often needing to rest by noon.  Her social history includes being a former smoker and drinker, having quit both a year ago.    Review of Systems  Constitutional:  Negative for chills and fever.  Respiratory:  Positive for shortness  of breath.   Cardiovascular:  Positive for chest pain and palpitations. Negative for leg swelling.  Gastrointestinal:  Positive for diarrhea.        Objective:    BP 134/78 (Cuff Size: Normal)   Pulse 76   Temp 97.9 F (36.6 C) (Oral)   Resp 16   Ht 5' 4.5" (1.638 m)   Wt 131 lb 3.2 oz (59.5 kg)   SpO2 96%   BMI 22.17 kg/m    Physical Exam Constitutional:      Appearance: Normal appearance.  HENT:     Head: Normocephalic and atraumatic.  Eyes:     Conjunctiva/sclera: Conjunctivae normal.  Cardiovascular:     Rate and Rhythm: Normal rate and regular rhythm.     Comments: Chest pain not reproducible to palpation  Pulmonary:     Effort: Pulmonary effort is normal.     Breath sounds: Normal breath sounds.  Skin:    General: Skin is warm and dry.  Neurological:     General: No focal deficit present.     Mental Status: She is alert. Mental status is at baseline.  Psychiatric:        Mood and Affect: Mood normal.        Behavior: Behavior normal.     No results found for any visits on 02/17/24.      Assessment & Plan:   Assessment & Plan Coronary artery disease Coronary artery disease with history of silent myocardial  infarction and atrial fibrillation. Recent chest pain requires evaluation to rule out coronary causes. - Perform EKG today - normal sinus rhythm with T wave inversion in V1, with signs of LVH - Consider referral to cardiologist for further evaluation. - History of cardiac ablation when she was in [facility], now with some PVC's and palpitations will go ahead and order Zio monitor.   Pulmonary nodules Incidental pulmonary nodules on CT without recommendations for follow up.  - Review recent imaging studies. - Consider follow-up imaging if symptoms persist.  Diarrhea Chronic diarrhea post-cholecystectomy with concerns about pancreatic and liver function. Recent normal lipase levels. - Order blood work to check thyroid, lipase, liver, and kidney  function. - Consider referral to gastroenterologist for further evaluation if blood work indicates issues.  - EKG 12-Lead - Ambulatory referral to Cardiology - TSH - LONG TERM MONITOR (3-14 DAYS); Future - COMPLETE METABOLIC PANEL WITHOUT GFR - Lipase - CBC w/Diff/Platelet   Return in about 6 months (around 08/19/2024).  Rockney Cid, DO

## 2024-02-18 ENCOUNTER — Encounter: Payer: Self-pay | Admitting: Internal Medicine

## 2024-02-18 LAB — COMPLETE METABOLIC PANEL WITHOUT GFR
AG Ratio: 1.9 (calc) (ref 1.0–2.5)
ALT: 12 U/L (ref 6–29)
AST: 14 U/L (ref 10–35)
Albumin: 4.6 g/dL (ref 3.6–5.1)
Alkaline phosphatase (APISO): 59 U/L (ref 37–153)
BUN: 16 mg/dL (ref 7–25)
CO2: 26 mmol/L (ref 20–32)
Calcium: 10.1 mg/dL (ref 8.6–10.4)
Chloride: 102 mmol/L (ref 98–110)
Creat: 0.83 mg/dL (ref 0.60–0.95)
Globulin: 2.4 g/dL (ref 1.9–3.7)
Glucose, Bld: 88 mg/dL (ref 65–99)
Potassium: 4.4 mmol/L (ref 3.5–5.3)
Sodium: 139 mmol/L (ref 135–146)
Total Bilirubin: 0.6 mg/dL (ref 0.2–1.2)
Total Protein: 7 g/dL (ref 6.1–8.1)

## 2024-02-18 LAB — CBC WITH DIFFERENTIAL/PLATELET
Absolute Lymphocytes: 1853 {cells}/uL (ref 850–3900)
Absolute Monocytes: 533 {cells}/uL (ref 200–950)
Basophils Absolute: 33 {cells}/uL (ref 0–200)
Basophils Relative: 0.5 %
Eosinophils Absolute: 98 {cells}/uL (ref 15–500)
Eosinophils Relative: 1.5 %
HCT: 44 % (ref 35.0–45.0)
Hemoglobin: 14.6 g/dL (ref 11.7–15.5)
MCH: 29.4 pg (ref 27.0–33.0)
MCHC: 33.2 g/dL (ref 32.0–36.0)
MCV: 88.5 fL (ref 80.0–100.0)
MPV: 10.7 fL (ref 7.5–12.5)
Monocytes Relative: 8.2 %
Neutro Abs: 3985 {cells}/uL (ref 1500–7800)
Neutrophils Relative %: 61.3 %
Platelets: 315 10*3/uL (ref 140–400)
RBC: 4.97 10*6/uL (ref 3.80–5.10)
RDW: 12.4 % (ref 11.0–15.0)
Total Lymphocyte: 28.5 %
WBC: 6.5 10*3/uL (ref 3.8–10.8)

## 2024-02-18 LAB — LIPASE: Lipase: <5 U/L — ABNORMAL LOW (ref 7–60)

## 2024-02-18 LAB — TSH: TSH: 1.09 m[IU]/L (ref 0.40–4.50)

## 2024-03-17 ENCOUNTER — Telehealth: Payer: Self-pay

## 2024-03-17 NOTE — Telephone Encounter (Signed)
 Tried to call x3 phone busy, we don not have holter/zio results yet

## 2024-03-17 NOTE — Telephone Encounter (Signed)
 Copied from CRM [id number]. Topic: Clinical - Medication Question >> Mar 17, 2024 12:36 PM Stacy Brewer wrote: Reason for CRM: Patient is wanting to know if the the information that was on her heart monitor will be sent to her cardiologist in time for her appointment on Monday at 10:30am with Dr. Parks Bollman.

## 2024-03-20 NOTE — Telephone Encounter (Signed)
 Copied from CRM [id number]. Topic: General - Other >> Mar 20, 2024 12:10 PM [employer] wrote: Reason for CRM: Patient would like a callback from Rockney Cid, DO to discuss her needing an echocardiogram or checking the 4mm lung nodule  Callback #: [phone]

## 2024-03-20 NOTE — Telephone Encounter (Signed)
 Seen heart doctor today and had atril fib and was put in beta blocker. Cardiologist stated he do Not think its her heart he want the 4mm nodule evaluated.

## 2024-03-22 ENCOUNTER — Telehealth: Payer: Self-pay

## 2024-03-22 ENCOUNTER — Ambulatory Visit: Payer: Self-pay

## 2024-03-22 NOTE — Telephone Encounter (Signed)
 Patient is on schedule for appointment tomorrow 03/23/2024. Will order a CT at that time

## 2024-03-22 NOTE — Telephone Encounter (Signed)
 Copied from CRM [id number]. Topic: Clinical - Red Word Triage >> Mar 22, 2024  2:47 PM Stacy Brewer wrote: Red Word that prompted transfer to Nurse Triage: Patient is having pain in her chest, she is short of breath she is dizzy. Can't hear well, has headaches. Hx of nodules on lungs. Reason for Disposition  [1] Chest pain lasts > 5 minutes AND [2] occurred > 3 days ago (72 hours) AND [3] NO chest pain or cardiac symptoms now    Still symptomatic, seen by Cardiologist 2 days ago and advised to follow up with PCP  Answer Assessment - Initial Assessment Questions 1. LOCATION: Where does it hurt?       Hurts in both of my lungs 2. RADIATION: Does the pain go anywhere else? (e.g., into neck, jaw, arms, back)     Radiates to back and right side  3. ONSET: When did the chest pain begin? (Minutes, hours or days)      1-2 months ago 4. PATTERN: Does the pain come and go, or has it been constant since it started?  Does it get worse with exertion?      Intermittent  6. SEVERITY: How bad is the pain?  (e.g., Scale 1-10; mild, moderate, or severe)    - MILD (1-3): doesn't interfere with normal activities     - MODERATE (4-7): interferes with normal activities or awakens from sleep    - SEVERE (8-10): excruciating pain, unable to do any normal activities       Dull ache 7. CARDIAC RISK FACTORS: Do you have any history of heart problems or risk factors for heart disease? (e.g., angina, prior heart attack; diabetes, high blood pressure, high cholesterol, smoker, or strong family history of heart disease)     Yes 8. PULMONARY RISK FACTORS: Do you have any history of lung disease?  (e.g., blood clots in lung, asthma, emphysema, birth control pills)     Lung nodules  9. CAUSE: What do you think is causing the chest pain?     Believes due to lung nodules  10. OTHER SYMPTOMS: Do you have any other symptoms? (e.g., dizziness, nausea, vomiting, sweating, fever, difficulty breathing, cough)        Shortness of breath  Protocols used: Chest Pain-A-AH   FYI Only or Action Required?: FYI only for provider  Patient was last seen in primary care on 02/17/2024 by Rockney Cid, DO. Called Nurse Triage reporting Chest Pain. Symptoms began about a month ago. Interventions attempted: Other: Patient seen by Cardiologist 2 days ago. Symptoms are: gradually worsening.  Triage Disposition: See Physician Within 24 Hours  Patient/caregiver understands and will follow disposition?: Yes

## 2024-03-22 NOTE — Telephone Encounter (Signed)
 Copied from CRM [id number]. Topic: Clinical - Medical Advice >> Mar 22, 2024  8:09 AM Sasha H wrote: Reason for CRM: Pt called in wanting to know the update on her provider ordering a echo of her heart due to her holter results. Please reach out to pt.

## 2024-03-22 NOTE — Telephone Encounter (Signed)
 Patient called back in about this, she is frustrated because she states she's trying to find out about getting a CT scan ordered to follow up on nodules in her lungs from a scan years ago. Her cardiologist states that the pain she is having is coming from her lungs, not her heart. And suggested she get a CT scan ASAP. Patient complains of shortness of breath,chest pain, hearing issues, and headaches. Put over to nurse triage as well as sending this message.

## 2024-03-23 ENCOUNTER — Ambulatory Visit (INDEPENDENT_AMBULATORY_CARE_PROVIDER_SITE_OTHER): Admitting: Internal Medicine

## 2024-03-23 ENCOUNTER — Other Ambulatory Visit: Payer: Self-pay

## 2024-03-23 VITALS — BP 134/76 | HR 60 | Resp 16 | Ht 64.5 in | Wt 132.8 lb

## 2024-03-23 DIAGNOSIS — R002 Palpitations: Secondary | ICD-10-CM

## 2024-03-23 DIAGNOSIS — R918 Other nonspecific abnormal finding of lung field: Secondary | ICD-10-CM

## 2024-03-23 DIAGNOSIS — N644 Mastodynia: Secondary | ICD-10-CM | POA: Diagnosis not present

## 2024-03-23 DIAGNOSIS — I480 Paroxysmal atrial fibrillation: Secondary | ICD-10-CM

## 2024-03-23 NOTE — Progress Notes (Signed)
 Acute Office Visit  Subjective:     Patient ID: Stacy Brewer, female    DOB: 01-Nov-1940, 83 y.o.   MRN: [id number]  Chief Complaint  Patient presents with   Chest Pain    Follow up, all sx along with chest pain   Night Sweats   Dizziness   Headache    L side    Chest Pain  Associated symptoms include dizziness and headaches.  Dizziness Associated symptoms include chest pain and headaches.  Headache  Associated symptoms include dizziness.   Patient is in today for follow up on left side pain.   Discussed the use of AI scribe software for clinical note transcription with the patient, who gave verbal consent to proceed.  History of Present Illness   Stacy Brewer is an 83 year old female with atrial fibrillation who presents with recurrent atrial fibrillation and new symptoms including itching, swelling, night sweats, nausea, and headaches.  Atrial fibrillation recurred recently, and she underwent an EKG and Holter monitor. She started metoprolol 25 mg but experienced significant nausea shortly after taking it.  New symptoms began three to four months ago, including itching on the back of her hands, tops of her feet, and lower legs, exacerbated by touch and relieved by soothing creams. Swelling is noted in the back of her ankles.  She experiences night sweats severe enough to wet her hair and pajamas, along with nausea and decreased appetite, sometimes eating only one meal a day. She tolerates fruits like grapes and watermelon.  Headaches occur on the left side, radiating to her eye and cheekbone, occasionally extending to her back. Hearing is worsening.  She follows a modified diet for six months, improving her intestinal symptoms and balance. She no longer eats out and cooks for herself, having identified and eliminated dietary triggers.  Review of Systems  Cardiovascular:  Positive for chest pain.  Neurological:  Positive for dizziness and headaches.         Objective:    BP 134/76   Pulse 60   Resp 16   Ht 5' 4.5 (1.638 m)   Wt 132 lb 12.8 oz (60.2 kg)   SpO2 99%   BMI 22.44 kg/m  BP Readings from Last 3 Encounters:  03/23/24 134/76  02/17/24 134/78  08/19/23 122/82   Wt Readings from Last 3 Encounters:  03/23/24 132 lb 12.8 oz (60.2 kg)  02/17/24 131 lb 3.2 oz (59.5 kg)  08/19/23 139 lb 1.6 oz (63.1 kg)      Physical Exam Exam conducted with a chaperone present.  Constitutional:      Appearance: Normal appearance.  HENT:     Head: Normocephalic and atraumatic.   Eyes:     Conjunctiva/sclera: Conjunctivae normal.    Cardiovascular:     Rate and Rhythm: Normal rate and regular rhythm.  Pulmonary:     Effort: Pulmonary effort is normal.     Breath sounds: Normal breath sounds. No wheezing, rhonchi or rales.  Chest:  Breasts:    Right: Normal.     Left: Normal.  Lymphadenopathy:     Upper Body:     Right upper body: No supraclavicular, axillary or pectoral adenopathy.     Left upper body: No supraclavicular, axillary or pectoral adenopathy.   Skin:    General: Skin is warm and dry.   Neurological:     General: No focal deficit present.     Mental Status: She is alert. Mental status is at baseline.  Psychiatric:        Mood and Affect: Mood normal.        Behavior: Behavior normal.     No results found for any visits on 03/23/24.      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 Metoprolol 25 mg causing nausea, possibly exceeding tolerance. - Continue metoprolol 25 mg, consider dose adjustment if adverse effects persist. - Following with Cardiology   Lung Nodules Incidental lung nodules on CT from 2023, largest 4 mm in the right base, with symptoms (pain) warranting further evaluation despite radiation concerns. - Order CT scan of the lungs without contrast to evaluate lung  nodules.  Breast Examination Breast tenderness likely due to scar tissue or inflammation post-implant removal. Ultrasound preferred for evaluation. - Perform breast examination. - Order bilateral breast ultrasound. - Consider mammogram if required by imaging center.  Musculoskeletal Pain Chest pain likely related to costochondritis or musculoskeletal inflammation, possibly from previous surgery or rib dysfunction. - Recommend gentle stretching exercises twice daily. - Suggest use of Voltaren gel for anti-inflammatory effect.   - CT CHEST WO CONTRAST; Future - MM 3D SCREENING MAMMOGRAM BILATERAL BREAST; Future - US  LIMITED ULTRASOUND INCLUDING AXILLA LEFT BREAST ; Future - US  LIMITED ULTRASOUND INCLUDING AXILLA RIGHT BREAST; Future   Return for already scheduled.  Rockney Cid, DO

## 2024-03-28 ENCOUNTER — Ambulatory Visit
Admission: RE | Admit: 2024-03-28 | Discharge: 2024-03-28 | Disposition: A | Discharge status: Home / Self Care | Source: Ambulatory Visit | Attending: Internal Medicine | Admitting: Internal Medicine

## 2024-03-28 DIAGNOSIS — R918 Other nonspecific abnormal finding of lung field: Secondary | ICD-10-CM | POA: Insufficient documentation

## 2024-03-31 ENCOUNTER — Telehealth: Payer: Self-pay

## 2024-03-31 NOTE — Telephone Encounter (Signed)
 Copied from CRM [id number]. Topic: Clinical - Lab/Test Results >> Mar 31, 2024  8:31 AM Antwanette L wrote: Reason for CRM: Patient is calling to get her CT Scan results. Please contact patient at [phone] When the results are ready >> Mar 31, 2024  8:34 AM Antwanette L wrote: Please do not call the patient between 2pm-4pm

## 2024-04-04 ENCOUNTER — Ambulatory Visit: Payer: Self-pay

## 2024-04-04 NOTE — Telephone Encounter (Signed)
 FYI Only or Action Required?: FYI only for provider.  Patient was last seen in primary care on 03/23/2024 by Bernardo Fend, DO. Called Nurse Triage reporting Chest Pain. Symptoms began several months ago. Interventions attempted: Rest, hydration, or home remedies. Symptoms are: gradually worsening.  Triage Disposition: Call EMS 911 Now  Patient/caregiver understands and will follow disposition?: No, wishes to speak with PCP  Copied from CRM [id number]. Topic: Clinical - Red Word Triage >> Apr 04, 2024  8:50 AM Berwyn MATSU wrote: Red Word that prompted transfer to Nurse Triage: chest pain, sweating, fatigue, no appetite  Reason for Disposition  Visible sweat on face or sweat dripping down face  Answer Assessment - Initial Assessment Questions 1. LOCATION: Where does it hurt?       Left Sided Chest Pain  2. RADIATION: Does the pain go anywhere else? (e.g., into neck, jaw, arms, back)     Shoulder, Back, to the Right Side of the chest  3. ONSET: When did the chest pain begin? (Minutes, hours or days)      3 months ago  4. PATTERN: Does the pain come and go, or has it been constant since it started?  Does it get worse with exertion?      Worse with positioning and sometimes with exertion.   5. DURATION: How long does it last (e.g., seconds, minutes, hours)     Comes and goes, unsure of exact duration  6. SEVERITY: How bad is the pain?  (e.g., Scale 1-10; mild, moderate, or severe)    - MILD (1-3): doesn't interfere with normal activities     - MODERATE (4-7): interferes with normal activities or awakens from sleep    - SEVERE (8-10): excruciating pain, unable to do any normal activities       5-8  7. CARDIAC RISK FACTORS: Do you have any history of heart problems or risk factors for heart disease? (e.g., angina, prior heart attack; diabetes, high blood pressure, high cholesterol, smoker, or strong family history of heart disease)     High Cholesterol (Allergic to  Statins), Heart Surgeries, High Blood Pressure  8. PULMONARY RISK FACTORS: Do you have any history of lung disease?  (e.g., blood clots in lung, asthma, emphysema, birth control pills)     Lung Nodules  9. CAUSE: What do you think is causing the chest pain?     Unsure  10. OTHER SYMPTOMS: Do you have any other symptoms? (e.g., dizziness, nausea, vomiting, sweating, fever, difficulty breathing, cough)       Diaphoresis, Fatigue, Loss of Appetite, Nausea, Itching, Dyspnea, Fatigue, Headache, Earache, Pallor, Jaundice  11. PREGNANCY: Is there any chance you are pregnant? When was your last menstrual period?       No and No  Protocols used: Chest Pain-A-AH

## 2024-04-10 ENCOUNTER — Telehealth: Payer: Self-pay

## 2024-04-10 NOTE — Telephone Encounter (Signed)
 Copied from CRM [id number]. Topic: Clinical - Lab/Test Results >> Apr 10, 2024  9:29 AM Delon HERO wrote: Reason for CRM: Patient is calling to report that she reviewed her CT CHEST WO CONTRAST (Accession [id number]) (Order [id number]). Wanting to know does she need to go to pulmonary? Advised that Dr. Bernardo would review and result the notes.  Patient is calling to report that she had a previous CT Scan 2 years under the care Dr. Jamee Grip with [facility]. Patient does not see the comparison of the lung CT in MyChart from this previous lung CT and stomach due divticultis. Show the edge of her right lung lower lobes with nodules. Advised that the lungs needed attention. At that time there was 4mm.    Patient would like these CT scans to compared to see if there has been any growth. These records are with [facility] or [facility] under the care of Dr. Jamee Grip. [facility] [address] [place], [place]. [address] [phone]

## 2024-04-11 ENCOUNTER — Ambulatory Visit: Payer: Self-pay | Admitting: Internal Medicine

## 2024-04-19 ENCOUNTER — Other Ambulatory Visit: Payer: Self-pay | Admitting: Internal Medicine

## 2024-04-19 DIAGNOSIS — Z8709 Personal history of other diseases of the respiratory system: Secondary | ICD-10-CM

## 2024-04-19 DIAGNOSIS — R079 Chest pain, unspecified: Secondary | ICD-10-CM

## 2024-04-28 ENCOUNTER — Telehealth: Payer: Self-pay

## 2024-04-28 ENCOUNTER — Ambulatory Visit: Payer: Self-pay | Admitting: *Deleted

## 2024-04-28 NOTE — Telephone Encounter (Signed)
 Copied from CRM [id number]. Topic: Clinical - Medication Question >> Apr 28, 2024 12:11 PM Myrick T wrote: Reason for CRM: please reference 7/18 NT note. Possible miscommunication with previous call. Patient said she saw where it was noted that she was having chest pains and she says she never said that. She said she only wanted something to give her an appetite. Patient said she would go a day or so with no food however she was drinking fluids. Patient also said she didn't want to come into the office but didn't want provider to think she was having chest pains and then refused to come in the office. Previous note does say she had experienced chest pains in May that got better. Forwarded to TL.

## 2024-04-28 NOTE — Telephone Encounter (Signed)
 Copied from CRM [id number]. Topic: Clinical - Red Word Triage >> Apr 28, 2024  8:11 AM Marissa P wrote: Red Word that prompted transfer to Nurse Triage: Patient has had a pain in her chest since May, she is still dealing with which did get better. Currently, very concerned because she is getting weak not eating and no appetite not even a meal a day. Patient is drinking, would like to get help with appetite please Reason for Disposition  [1] Chest pain(s) lasting a few seconds AND [2] persists > 3 days    See triage notes for details.  PT refusing to make an appt.  Answer Assessment - Initial Assessment Questions 1. LOCATION: Where does it hurt?       The chest pain is something that was addressed back in May.  I've had a CT scan and 2 Decho.  Dr. Bernardo is working with me.   I'm having shortness of breath and the other symptoms.   The chest pain has lessened over the months.   I do stretches and exerises. which has helped.   Dr. Bernardo thinks it's a rib.   I'm very thin.  I'm having pain during the night last night and it's better.  I don't have much appetite.  I've not eaten a meal a day for a week.  I don't  appetite.   I need a stimulate for my appetite.   I'm drinking plenty of water and hot tea.  I'm keeping hydrated and tea to help my arteries. Fluids is all I want.  I don't want any food.   I've never had this this bad.   This is the worst I've ever had with no  appetite. I don't need to come in.   I've been to many doctors.   I have no energy to get dressed and come in.   I just need an appetite stimulate.    I'm weak.   I need food.   I need to build my body up.   I don't need to come in.    I just need an appetite stimulant.    Triage was ended here as she is refusing to make an appt to come in and claims Dr. Bernardo has done a bunch of tests on me and knows what is going on.  I don't need to come in.   I let her know I would send a high priority message to Dr. Bernardo.   Pt was agreeable  to this and ended the call. 2. RADIATION: Does the pain go anywhere else? (e.g., into neck, jaw, arms, back)     See above 3. ONSET: When did the chest pain begin? (Minutes, hours or days)      Since May but is much better.   No appetite is her concern. 4. PATTERN: Does the pain come and go, or has it been constant since it started?  Does it get worse with exertion?      I have not eaten a meal in a week.  I'm getting weak and need to eat.  Nothing sounds good.  I've gone to the grocery store and bought stuff but it just goes bad because I don't have any appetite.  I need to build my body up.   I'm too weak to come in.   I've been in multiple times.  Dr. Bernardo knows what is going on.   5. DURATION: How long does it last (e.g., seconds, minutes, hours)  See above 6. SEVERITY: How bad is the pain?  (e.g., Scale 1-10; mild, moderate, or severe)     Severely does not have an appetite. 7. CARDIAC RISK FACTORS: Do you have any history of heart problems or risk factors for heart disease? (e.g., angina, prior heart attack; diabetes, high blood pressure, high cholesterol, smoker, or strong family history of heart disease)     See above 8. PULMONARY RISK FACTORS: Do you have any history of lung disease?  (e.g., blood clots in lung, asthma, emphysema, birth control pills)     Dr. Bernardo knows my history and knows my test results. 9. CAUSE: What do you think is causing the chest pain?     I don't know why I don't have an appetite. 10. OTHER SYMPTOMS: Do you have any other symptoms? (e.g., dizziness, nausea, vomiting, sweating, fever, difficulty breathing, cough)       Very weak.   Refusing to make an appt to come in. 11. PREGNANCY: Is there any chance you are pregnant? When was your last menstrual period?       N/A due to age  Protocols used: Chest Pain-A-AH FYI Only or Action Required?: Action required by provider: clinical question for provider and update on patient  condition.  Patient was last seen in primary care on 03/23/2024 by Bernardo Fend, DO.  Called Nurse Triage reporting Chest Pain.  Symptoms began several weeks ago.  Interventions attempted: Other: No appetite.  Unable to eat because she has no appetite.  Requesting an appetite stimulant be called in. She is refusing an appt.   Dr. Bernardo know what is going on with me.   I don't need to come in again.   Chest pain is something that has been going on since May.    I've had many tests.   The lack of appetite is what's concerning me.  I need a appetite stimulant called in.     Symptoms are: rapidly worsening. Has not eaten a meal in a week.  Getting very weak.   Refusing an appt.  Triage Disposition: Call PCP Now  Patient/caregiver understands and will follow disposition?:   Wants to speak with Dr. Bernardo.

## 2024-05-01 ENCOUNTER — Other Ambulatory Visit: Payer: Self-pay | Admitting: Internal Medicine

## 2024-05-01 DIAGNOSIS — R630 Anorexia: Secondary | ICD-10-CM

## 2024-05-01 MED ORDER — MIRTAZAPINE 15 MG PO TABS: 15.0000 mg | ORAL_TABLET | Freq: Every day | ORAL | 0 refills | Status: DC

## 2024-05-03 NOTE — Telephone Encounter (Signed)
 Requested medication (s) are due for refill today:   Yes  Requested medication (s) are on the active medication list:   Yes  Future visit scheduled:   Yes 11/11   Last ordered: 05/01/2024 #90, 0 refills  Prior authorization needed for brand reason returned  Requested Prescriptions  Pending Prescriptions Disp Refills   mirtazapine  (REMERON ) 15 MG tablet [Pharmacy Med Name: MIRTAZAPINE  15 MG TABLET]  0     Psychiatry: Antidepressants - mirtazapine  Passed - 05/03/2024  2:46 PM      Passed - Valid encounter within last 6 months    Recent Outpatient Visits           1 month ago Abnormal findings on diagnostic imaging of lung   [facility] Bernardo Fend, DO   2 months ago Chest pain, unspecified type   [facility] Bernardo Fend, [place]

## 2024-05-05 ENCOUNTER — Telehealth: Payer: Self-pay

## 2024-05-05 ENCOUNTER — Other Ambulatory Visit (HOSPITAL_COMMUNITY): Payer: Self-pay

## 2024-05-05 NOTE — Telephone Encounter (Signed)
 Mirtazapine  15 mg was filled at [facility] 05/02/2024, next fill available date is 05/25/2024

## 2024-05-05 NOTE — Telephone Encounter (Signed)
 Copied from CRM #[id number]. Topic: Clinical - Prescription Issue >> May 05, 2024  9:04 AM Tobias CROME wrote: Reason for CRM: Patient states the generic of remeron  is not working for her. Patient states she just picked up the prescription from the pharmacy, however patient states the generic does not work as well for her.   Patient is inquiring if brand name remeron  could be sent in for her, patient is willing to pay the difference as she knows the insurance will not cover the brand name.   Patient also concerned she may not be able to pick up the brand name if sent in for her as she just picked up the generic brand a few days.  Patient requesting assistance, [phone]

## 2024-05-08 ENCOUNTER — Other Ambulatory Visit: Payer: Self-pay | Admitting: Internal Medicine

## 2024-05-08 DIAGNOSIS — R630 Anorexia: Secondary | ICD-10-CM

## 2024-05-08 MED ORDER — MIRTAZAPINE 15 MG PO TABS: 15.0000 mg | ORAL_TABLET | Freq: Every day | ORAL | 0 refills | Status: AC

## 2024-05-09 ENCOUNTER — Telehealth: Payer: Self-pay

## 2024-05-09 NOTE — Telephone Encounter (Unsigned)
 Copied from CRM [id number]. Topic: Clinical - Prescription Issue >> May 09, 2024 12:24 PM Jasmin G wrote: Reason for CRM: Pt states that she got prescribed mirtazapine  (REMERON ) 15 MG tablet recently, she has tried it in the past and everything was fine, but it seems like this time her insurance company gave her a generic form of it to which she is not reacting well, pt has scoliosis and has been experiencing stiffness and extreme loss of appetite after starting taking the generic form, please call pt back ASAP to establish best course of action

## 2024-05-10 NOTE — Telephone Encounter (Signed)
 Pt.notified

## 2024-08-22 ENCOUNTER — Ambulatory Visit: Admitting: Internal Medicine
